# Patient Record
Sex: Male | Born: 1970 | Race: Black or African American | Hispanic: No | Marital: Single | Smoking: Current every day smoker
Health system: Southern US, Community
[De-identification: ages and names within clinical notes are randomized; demographics above are authoritative.]

## PROBLEM LIST (undated history)

## (undated) ENCOUNTER — Emergency Department (HOSPITAL_COMMUNITY): Discharge status: Absent without leave | Payer: Self-pay

## (undated) DIAGNOSIS — S21119A Laceration without foreign body of unspecified front wall of thorax without penetration into thoracic cavity, initial encounter: Secondary | ICD-10-CM

## (undated) HISTORY — PX: CHEST TUBE INSERTION: SHX231

## (undated) HISTORY — PX: OTHER SURGICAL HISTORY: SHX169

---

## 1997-10-17 ENCOUNTER — Emergency Department (HOSPITAL_COMMUNITY): Admission: EM | Admit: 1997-10-17 | Discharge: 1997-10-17 | Payer: Self-pay | Admitting: Emergency Medicine

## 1999-07-18 ENCOUNTER — Emergency Department (HOSPITAL_COMMUNITY): Admission: EM | Admit: 1999-07-18 | Discharge: 1999-07-18 | Payer: Self-pay | Admitting: Emergency Medicine

## 2000-01-22 ENCOUNTER — Inpatient Hospital Stay (HOSPITAL_COMMUNITY): Admission: EM | Admit: 2000-01-22 | Discharge: 2000-01-24 | Payer: Self-pay | Admitting: Emergency Medicine

## 2000-01-22 ENCOUNTER — Encounter: Payer: Self-pay | Admitting: Emergency Medicine

## 2002-03-18 ENCOUNTER — Emergency Department (HOSPITAL_COMMUNITY): Admission: AC | Admit: 2002-03-18 | Discharge: 2002-03-18 | Payer: Self-pay

## 2002-03-18 ENCOUNTER — Encounter: Payer: Self-pay | Admitting: Emergency Medicine

## 2004-02-04 ENCOUNTER — Inpatient Hospital Stay (HOSPITAL_COMMUNITY): Admission: EM | Admit: 2004-02-04 | Discharge: 2004-02-08 | Payer: Self-pay | Admitting: Emergency Medicine

## 2005-02-09 ENCOUNTER — Emergency Department (HOSPITAL_COMMUNITY): Admission: EM | Admit: 2005-02-09 | Discharge: 2005-02-09 | Payer: Self-pay | Admitting: Emergency Medicine

## 2005-03-10 ENCOUNTER — Emergency Department (HOSPITAL_COMMUNITY): Admission: EM | Admit: 2005-03-10 | Discharge: 2005-03-10 | Payer: Self-pay | Admitting: Emergency Medicine

## 2008-04-03 ENCOUNTER — Inpatient Hospital Stay (HOSPITAL_COMMUNITY): Admission: EM | Admit: 2008-04-03 | Discharge: 2008-04-06 | Payer: Self-pay | Admitting: Emergency Medicine

## 2010-11-11 NOTE — H&P (Signed)
NAME:  Dennis Bailey, Dennis Bailey                 ACCOUNT NO.:  [id number]   MEDICAL RECORD NO.:  [id number]          PATIENT TYPE:  EMS   LOCATION:  ED                           FACILITY:  [facility]   PHYSICIAN:  Lucita Ferrara, MD         DATE OF BIRTH:  05/24/1971   DATE OF ADMISSION:  04/03/2008  DATE OF DISCHARGE:                              HISTORY & PHYSICAL   PRIMARY CARE DOCTOR:  Unassigned.   CHIEF COMPLAINT:  Abdominal pain.   HISTORY OF PRESENT ILLNESS:  The patient is 40 year old African American  male presents to [facility] with chief complaint of  abdominal pain located midepigastric, much similar to his previous  abdominal pain which he has had in the past when he was diagnosed with  pancreatitis.  The patient states that whenever he drinks alcohol, he  has exacerbation of his pancreatitis.  The patient last drank liquor  all day on Saturday and abdominal pain started on Monday.  He has, in  addition to abdominal pain, nausea and vomiting that is nonbilious,  nonbloody.  Abdominal pain is radiating to his lower back.  He is unable  to tolerate any p.o.  He denies any chest pain, shortness of breath,  orthopnea.  Otherwise his review of systems is negative.  Denies any  fevers, urinary frequency, urgency or burning.   PAST MEDICAL HISTORY:  1. Significant for chronic alcohol abuse.  2. Acute pancreatitis.   SOCIAL HISTORY:  He smokes 1/2 pack per day for the last 1 year.  He is  a heavy drinker.  He denies other drugs.   ALLERGIES:  No known drug allergies.   MEDICATIONS:  None.   PHYSICAL EXAMINATION:  GENERAL:  The patient is in no acute distress.  VITAL SIGNS:  Blood pressure is 129/87, pulse 71, respirations 20,  temperature 97.8.  HEENT: Normocephalic, atraumatic.  Sclerae is anicteric.  Neck is supple.  No JVD, no carotid bruits.  CARDIOVASCULAR:  S1, S2 regular rate and rhythm.  No murmurs, rubs or  clicks.  ABDOMEN:  There are positive bowel sounds.  The  patient is tender to  palpation.  There is no guarding and no hepatosplenomegaly.  EXTREMITIES:  No clubbing, cyanosis or edema.  NEURO:  Patient is alert, oriented x3.  Cranial nerves 2-12 grossly  intact.   The patient was started on IV Rocephin in the emergency room.  The  patient also was given Dilaudid and Zofran for antiemetic purposes and  pain control.  CT of the abdomen and pelvis shows finding consistent  with acute pancreatitis.  Lipase is 111, blood alcohol level less than  5, and the patient's AST, ALT is 75, 13, alk phos is 103, BUN 12,  creatinine 1.18, white count 20.8, hemoglobin 16.9, hematocrit 49.2, MCV  is 94.1, platelets 293,000.  Urinalysis shows no leukocyte esterase or  nitrites.   ASSESSMENT AND PLAN:  A 40 year old with severe abdominal pain secondary  to;  1. Acute pancreatitis with nausea and vomiting and inability to      tolerate p.o.  2. Chronic alcohol abuse.  3. Tobacco abuse.  4. Leukocytosis likely reactive and secondary to #1 with no fevers,      chills or sources of acute infection.   DISCUSSION AND PLAN:  1. Will go ahead and admit the patient to medical telemetry unit.      Will keep the patient n.p.o. will initiate IV fluids, pain control,      and emesis control with Dilaudid and Zofran.  Surgical consultation      only if there is an acute abdomen and the patient's symptoms are      not resolved.  Will need to monitor lytes, Ranson's criteria, which      is pretty low at this point.  2. Alcohol abuse.  Will put the patient on Ativan protocol and will      also get smoking cessation literature in addition to a nicotine      patch.   The rest of the plans are dependent on his progress and consultant  recommendations.      Lucita Ferrara, MD  Electronically Signed     RR/MEDQ  D:  04/03/2008  T:  04/03/2008  Job:  [id number]

## 2010-11-11 NOTE — Discharge Summary (Signed)
NAME:  Dennis Bailey, Dennis Bailey                 ACCOUNT NO.:  [id number]   MEDICAL RECORD NO.:  [id number]          PATIENT TYPE:  INP   LOCATION:  1439                         FACILITY:  [facility]   PHYSICIAN:  Lonia Blood, M.D.      DATE OF BIRTH:  08-22-1970   DATE OF ADMISSION:  04/03/2008  DATE OF DISCHARGE:                               DISCHARGE SUMMARY   PRIMARY CARE PHYSICIAN:  Unassigned.   DISCHARGE DIAGNOSIS:  1. Acute alcoholic pancreatitis.  2. Alcoholism.  3. Tobacco abuse.  4. Leukocytosis, all resolved.   DISCHARGE MEDICATIONS:  1. Thiamine 100 mg daily.  2. Folic acid 1 mg daily.  3. Ativan 1 mg q.8 h p.r.n.  4. Multivitamins daily.   DISPOSITION:  The patient is being discharged home in good health.  His  pancreatitis has resolved.  He has been advised to quit drinking.  He  has been counseled extensively on drinking and he promises to go to the  [facility] program.  Of note, the patient is going to follow-up in the clinic  with his doctor.  Further counseling will take place.   PROCEDURES PERFORMED THIS ADMISSION:  1. Abdominal x-ray on October 6 that shows no acute findings.  2. CT abdomen and pelvis also on April 03, 2008, shows findings      consistent with acute pancreatitis, but no pseudocyst or abscess.   CONSULTATIONS:  None.   BRIEF HISTORY AND PHYSICAL:  Please refer to dictated history and  physical on admission by Dr. Flonnie Overman.  In short, the patient is 40-year-  old gentleman with history of alcohol intake and previous history of  pancreatitis coming in with exacerbation of pancreatitis with mainly  abdominal pain, nausea, vomiting.  Findings at the time of admission  including lipase etc of 111 indicated that the patient was having an  acute pancreatitis attack, most likely triggered by alcohol.  The  patient was subsequently admitted for treatment.   HOSPITAL COURSE:  1. Acute pancreatitis.  The patient was admitted, had bowel rest, pain      medication, IV  fluids.  The patient responded after 48 hours.  His      lipase has normalized.  He was restarted on diet and seemed to have      tolerated everything well.  At this point, we are discharging him      home with counseling on alcohol intake.  2. Alcohol abuse.  Again the patient was counseled extensively during      this hospitalization as indicated above.  3. Tobacco abuse.  The patient received a nicotine patch in the      hospital.  He has also been counseled extensively to quit that.  4. Leukocytosis.  His white count was a size 22,000, probably from the      inflammation.  It has been continuously      decreasing.  5. Acute renal failure.  His creatinine rose to 1.29.  But this has      been trending downwards since.  Otherwise, the patient is stable  for discharge and will proceed with discharge.      Lonia Blood, M.D.  Electronically Signed     LG/MEDQ  D:  04/06/2008  T:  04/06/2008  Job:  [id number]

## 2010-11-14 NOTE — Discharge Summary (Signed)
[place]  Patient:    Dennis Bailey, Dennis Bailey                          MRN: [id number] Adm. Date:  [id number] Disc. Date: [id number] Attending:  Edwyna Perfect Dictator:   Susie Cassette, M.D. CC:         Leory Plowman, M.D.                           Discharge Summary  DISCHARGE DIAGNOSES: 1. Mild alcoholic pancreatitis. 2. Alcohol abuse.  DISCHARGE MEDICATIONS:  Darvocet one tablet q.6h. p.r.n. for pain.  FOLLOW-UP:  [name] was instructed that the clinic at [facility] will contact him for a follow-up appointment.  PROCEDURES:  CT of the abdomen and pelvis revealed that [name] were compatible with pancreatitis.  There was also peripancreatic fluid extending down both pericolic gutters with minimal perihepatic ascites also seen.  CONSULTATIONS:  None.  HISTORY OF PRESENT ILLNESS:  This is a 40 year old African-American male with a chief complaint of abdominal pain.  [name] presented to the ER with complaints of abdominal cramping x 2 days, which started after he drank alcohol.  This was associated with nausea and vomiting.  The vomiting occurred every one to two hours.  It initially consisted of food particles and was later noted to be bilious.  He denied blood in this vomit.  The pain was worsened with food intake.  [name] tried Percocet, Advil, and Pepto-Bismol for this pain, but none of these improved his symptoms.  He does admit to drinking a half of a case of beer and a pint of liquor on Monday, after which his symptoms started.  He also reports nonbloody diarrhea.  He denies a history of reflux disease, peptic ulcer disease, or pancreatitis.  PAST MEDICAL HISTORY:  Not remarkable.  MEDICATIONS PRIOR TO ADMISSION:  None.  ALLERGIES:  None.  FAMILY HISTORY:  [name] mother had hypertension.  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  He smokes one pack a  day.  He drinks a six-pack a day of beer. He denies IV drug use and cocaine use.  PHYSICAL EXAMINATION:  Temperature 99.5 degrees, pulse 108, respirations 22, BP 117/96, saturations 95% on room air.  GENERAL APPEARANCE:  This an alert and oriented x 3, well-nourished, African-American male.  HEENT:  Significant for pupils equally round and reactive to light. Extraocular muscles intact.  Oral mucosa moist.  NECK:  No JVD.  No bruits.  The neck is supple.  CARDIOVASCULAR:  S1 and S2 are present with a regular rhythm.  Tachycardic. No murmurs, rubs, or gallops.  RESPIRATORY:  Clear to auscultation bilaterally.  ABDOMEN:  Tenderness in the epigastric region and the periumbilical area. Nondistended.  No rebound.  EXTREMITIES:  No clubbing, cyanosis, or edema.  The pulses are 2+.  RECTAL:  Rectal tone is good.  Stool is guaiac negative.  NEUROLOGIC:  Nothing focal.  cranial nerves II-XII grossly intact.  SKIN:  Warm and dry.  ADMISSION LABORATORY DATA:  The white count was 20.8, hemoglobin 17, and platelets 197.  Sodium 131, potassium 3.9, chloride 96, bicarbonate 25, BUN 10, creatinine 1.1, glucose 115, calcium 9.3, total protein 6.6, albumin 3.6, AST 31, ALT 11, alkaline phosphatase 87, total bilirubin 1.6, amylase 168, lipase 100.  Urinalysis:  Protein greater than  300, nitrite positive, leukocyte esterase trace, white blood cells 0-5, red blood cells 6-10, many bacteria, and granular and amorphous casts.  CT of the abdomen:  Pancreatic fluid in the pericolic gutters.  HOSPITAL COURSE: #1 - MILD ALCOHOLIC PANCREATITIS:  [name] was initially admitted with abdominal pain and evidence of pancreatitis both on abdominal CT, as well as enzyme levels of amylase and lipase.  He was initially managed by being made NPO and started on D5 normal saline at 200 cc/hr.  In addition, he was given morphine and Phenergan for pain and nausea.  By the next morning, [name] was anxious to  begin p.o. intake, he was gradually backed down on his IV fluids, is pain medicines were changed to p.o., and his diet was advanced.  He tolerated this advance in diet well and was requiring minimal pain medication. The next morning, he was sent home with p.o. Darvocet.  #2 - ALCOHOL ABUSE:  [name] was repeated with thiamine and folate during his stay in the hospital.  In addition, he was seen by care management for evaluation and referral of alcohol abuse.  They discussed with him services available to substance abusers and reviewed a resource fact sheet with him. He did express interest in these services.  In addition, an intensive outpatient program was also discussed with him.  DISCHARGE LABORATORY DATA:  None.  DISPOSITION:  To home.  CONDITION ON DISCHARGE:  Good. DD:  01/28/00 TD:  01/29/00 Job: [id number]
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
## 2010-11-14 NOTE — H&P (Signed)
NAME:  Dennis Bailey, Dennis Bailey                             ACCOUNT NO.:  [id number]   MEDICAL RECORD NO.:  [id number]                   PATIENT TYPE:  EMS   LOCATION:  ED                                   FACILITY:  [facility]   PHYSICIAN:  Hollice Espy, M.D.            DATE OF BIRTH:  11-Jul-1970   DATE OF ADMISSION:  02/04/2004  DATE OF DISCHARGE:                                HISTORY & PHYSICAL   CHIEF COMPLAINT:  Abdominal pain.   The patient is a 40 year old African-American male with no past medical  history, who drinks approximately 3 beers every day, and his last drink was  yesterday evening.  In the middle of the night, he started having severe  abdominal pain, located mostly in the mid epigastric region, radiating to  his back.  He denied any vomiting but was having nausea.  Pain was  continuous, occasionally going up and down in the severity.  He became  concerned and came into the emergency room for further evaluation.  In the  emergency room, he was found to have a lipase level of 191 and was diagnosed  with pancreatitis.  The patient was given IV fluids, pain medication, and  antinausea medicine.  He still complains of some abdominal pain but feels a  little bit better.  He denies any other symptoms.  He denies any headaches,  visual changes, dysphagia, chest pain, palpitations.  Denies any shortness  of breath, wheeze, cough, hematuria, dysuria, constipation, no diarrhea.  He  denies any focal extremity weakness, although overall he feels fatigued.   PAST MEDICAL HISTORY:  None.   MEDICATIONS:  None.   ALLERGIES:  None.   SOCIAL HISTORY:  He admits to smoking a pack a day for many years.  He  drinks at least 3 beers a day, but I feel he is likely understating this.  He denies any drug use.   FAMILY HISTORY:  Positive for CAD and hypertension.   PHYSICAL EXAMINATION:  VITAL SIGNS:  Temp 96.6, heart rate 75, blood  pressure 127/85, respirations 22, O2 saturation 97% on room  air.  GENERAL:  He alert and oriented x 3, in some minor distress secondary to his  abdominal pan.  HEENT:  Normocephalic, atraumatic.  His mucous membranes are slightly dry.  He has no carotid bruits.  HEART:  Regular rate and rhythm, S1 and S2 clear to auscultation  bilaterally.  ABDOMEN:  Soft and nondistended.  It is slightly tender in mid epigastric  region.  He has hypoactive bowel sounds.  EXTREMITIES:  No cyanosis, clubbing, or edema.   LABORATORY DATA:  His UA is noted to have a trace hemoglobin, 30 of protein.  White count is 15.8, H&H 16.5 and 47.7, MCV 93, platelet count 290 with 72%  neutrophils which is normal.  Sodium 139, potassium 3.8, chloride 105,  bicarb 25, BUN 11, creatinine 1.6, glucose 111.  Calcium is 9.1.  AST is  slightly elevated at 40.  The rest of his LFTs are within normal limits.  His lipase level is elevated at 191.  His UA is essentially normal  otherwise.   ASSESSMENT AND PLAN:  1. Pancreatitis secondary to alcohol abuse, first episode.  NPO and follow     his lipase level.  2. Alcohol abuse, 3 beers a day.  He likely drinks more than this but watch     for withdrawal signs.  He is advised to quit drinking.  3. Tobacco abuse.                                               Hollice Espy, M.D.    SKK/MEDQ  D:  02/04/2004  T:  02/04/2004  Job:  [id number]
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
## 2010-11-14 NOTE — Discharge Summary (Signed)
NAME:  Dennis Bailey, Dennis Bailey                             ACCOUNT NO.:  [id number]   MEDICAL RECORD NO.:  [id number]                   PATIENT TYPE:  INP   LOCATION:  0347                                 FACILITY:  [facility]   PHYSICIAN:  Jackie Plum, M.D.             DATE OF BIRTH:  [birth year]   DATE OF ADMISSION:  02/04/2004  DATE OF DISCHARGE:  02/08/2004                                 DISCHARGE SUMMARY   DISCHARGE DIAGNOSES:  1. Alcoholic pancreatitis, resolved.  2. Alcohol and tobacco abuse.  3. Mild normocytic anemia, stable.  Outpatient followup recommended.   DISCHARGE MEDICATIONS:  1. Darvocet-N 100 one to two tablets q.4-6h. p.r.n.  2. Protonix 40 mg daily.   ACTIVITY:  As tolerated.   DIET:  Regular diet to be continued.   FOLLOWUP:  The patient is to report to the M.D. if he experiences any  problems.  He is to follow up with [facility] to see a Dennis Bailey in  two weeks.   CONSULTANTS:  Not applicable.   PROCEDURES:  Not applicable.   CONDITION ON DISCHARGE:  Improved and satisfactory.   REASON FOR ADMISSION:  Acute pancreatitis.   HISTORY OF PRESENT ILLNESS:  The patient had been binging on alcohol, i.e.  beer about three beers every day and presented with severe abdominal pain in  his mid epigastric region radiating to his back.  He had elevated lipase and  amylase and therefore was admitted.  At the time of admission, the BP was  127/85 with a pulse rate of 75 and he was afebrile.  His saturation on room  air was 97%.  The abdominal exam revealed a mid epigastric tenderness.  Laboratory work revealed elevated lipase and amylase.  He was therefore  admitted for alcohol-induced pancreatitis.   HOSPITAL COURSE:  He was admitted to the hospitalist service to a regular  bed.  He was started on a regimen of bowel rest and supportive care with IV  fluid supplementation and analgesics.  His laboratories were monitored.  The  next day, the patient's symptoms  actually go a little bit worse.  He had  some fever.  On account of concerns of complications of his pancreatitis, a  CT scan of the abdomen was done on February 05, 2004, and the results indicated  findings consistent with considerable edema and __________ surrounding the  pancreas and extending along the right side of the liver and both  pericolonic gutters and into the pelvis.  There was no pseudocyst or  abscess.  His antibiotics were subsequently discontinued without any fevers.  His symptoms improved and he was started on clear liquids, which was  subsequently advanced and the patient was able to tolerate a full regular  diet at the time of discharge.  He is therefore being discharged home on  stable and satisfactory condition to continue with a regular diet.  He has  been  counseled to stop binging on alcohol.  The effect of alcohol on his  general health was also discussed.  On rounds this morning, the patient is  feeling well.  He does not have any nausea or vomiting.  He had just mild  epigastric pain.  His BP was 142/90 with a pulse of 87, a temperature of  98.6 degrees Fahrenheit and a respiratory rate of 20.  He was not acutely  ill looking.  His mucous membranes were moist.  He did not have any  irritation or edema of his mucous membranes.  His lungs were clear to  auscultation.  The abdominal exam was negative for any tenderness.  Bowel  sounds were present and were normoactive.  Extremity exam with no edema.  He  was alert and oriented x 3 with no acute focal deficits.  Laboratory work  this morning showed WBC count of 10.0, hemoglobin of 12.8, hematocrit of  37.0, MCV 94.5 and platelet count 317.  Sodium 140, potassium 4.0, chloride  106, CO2 29, glucose 99, BUN 3, creatinine 1.1, calcium 8.7, amylase 142,  lipase 82.  Dennis Bailey is therefore being discharged home in stable condition  to follow up with [facility].                                                Jackie Plum, M.D.    GO/MEDQ  D:  02/08/2004  T:  02/08/2004  Job:  [id number]   cc:   [facility]

## 2011-01-20 ENCOUNTER — Encounter: Payer: Self-pay | Admitting: *Deleted

## 2011-01-20 DIAGNOSIS — L0231 Cutaneous abscess of buttock: Secondary | ICD-10-CM | POA: Insufficient documentation

## 2011-01-20 DIAGNOSIS — Z5320 Procedure and treatment not carried out because of patient's decision for unspecified reasons: Secondary | ICD-10-CM | POA: Insufficient documentation

## 2011-01-20 NOTE — ED Notes (Signed)
Pt reports "bump" to right buttock x 4 months; is here tonight because he is concerned this may be a cancerous tumor

## 2011-01-21 ENCOUNTER — Emergency Department (HOSPITAL_COMMUNITY)
Admission: EM | Admit: 2011-01-21 | Discharge: 2011-01-21 | Discharge status: Against Medical Advice | Payer: Self-pay | Attending: *Deleted | Admitting: *Deleted

## 2011-01-21 ENCOUNTER — Encounter (HOSPITAL_COMMUNITY): Payer: Self-pay | Admitting: Emergency Medicine

## 2011-01-21 ENCOUNTER — Emergency Department (HOSPITAL_COMMUNITY)
Admission: EM | Admit: 2011-01-21 | Discharge: 2011-01-21 | Disposition: A | Discharge status: Home / Self Care | Payer: Self-pay | Attending: Emergency Medicine | Admitting: Emergency Medicine

## 2011-01-21 DIAGNOSIS — F17200 Nicotine dependence, unspecified, uncomplicated: Secondary | ICD-10-CM | POA: Insufficient documentation

## 2011-01-21 DIAGNOSIS — K612 Anorectal abscess: Secondary | ICD-10-CM | POA: Insufficient documentation

## 2011-01-21 MED ORDER — HYDROCODONE-ACETAMINOPHEN 5-500 MG PO TABS: 1.0000 | ORAL_TABLET | Freq: Four times a day (QID) | ORAL | Status: AC | PRN

## 2011-01-21 MED ORDER — DOXYCYCLINE HYCLATE 100 MG PO CAPS: 100.0000 mg | ORAL_CAPSULE | Freq: Two times a day (BID) | ORAL | Status: AC

## 2011-01-21 NOTE — ED Notes (Signed)
Unable to locate pt in all waiting areas 

## 2011-01-21 NOTE — ED Provider Notes (Signed)
History     Chief Complaint  Patient presents with  . Recurrent Skin Infections   HPI Comments: Patient c/o persistent "bump" to left perineal area for 4 months.  States the bump swells but then goes down but does not resolve.  Also c/o itching to the area.  He denies dysuria, rectal pain or incontinence  Patient is a 40 y.o. male presenting with abscess. The history is provided by the patient.  Abscess  This is a chronic problem. The current episode started more than one week ago. The onset is undetermined. The problem occurs continuously. The problem has been gradually improving. The abscess is present on the left buttock. The problem is mild. The abscess is characterized by itchiness, painfulness and swelling. Associated with: nothing. Pertinent negatives include no decrease in physical activity, no fever, no diarrhea, no vomiting and no decreased responsiveness. There were no sick contacts. He has received no recent medical care.    History reviewed. No pertinent past medical history.  History reviewed. No pertinent past surgical history.  History reviewed. No pertinent family history.  History  Substance Use Topics  . Smoking status: Current Everyday Smoker -- 0.5 packs/day for 10 years    Types: Cigarettes  . Smokeless tobacco: Not on file  . Alcohol Use: 1.8 oz/week    3 Cans of beer per week     daily      Review of Systems  Constitutional: Negative for fever, chills, decreased responsiveness and unexpected weight change.  HENT: Negative.   Respiratory: Negative.   Cardiovascular: Negative.   Gastrointestinal: Negative.  Negative for vomiting and diarrhea.  Genitourinary: Negative for dysuria, frequency, decreased urine volume, discharge, penile swelling, scrotal swelling, penile pain and testicular pain.  Musculoskeletal: Negative for back pain and gait problem.  Skin: Positive for wound.  Neurological: Negative for weakness, numbness and headaches.  Hematological:  Does not bruise/bleed easily.    Physical Exam  BP 128/77  Pulse 87  Temp(Src) 98.6 F (37 C) (Oral)  Resp 18  Ht 6\' 2"  (1.88 m)  Wt 161 lb (73.029 kg)  BMI 20.67 kg/m2  SpO2 96%  Physical Exam  Nursing note and vitals reviewed. Constitutional: He is oriented to person, place, and time. He appears well-developed and well-nourished. No distress.  HENT:  Head: Normocephalic and atraumatic.  Cardiovascular: Normal rate, regular rhythm and normal heart sounds.   Pulmonary/Chest: Effort normal and breath sounds normal.  Abdominal: Soft. Bowel sounds are normal. There is no tenderness. There is no rebound and no guarding.  Genitourinary: Penis normal. Rectal exam shows tenderness. Rectal exam shows no external hemorrhoid. No penile tenderness.     Musculoskeletal: He exhibits no edema and no tenderness.  Neurological: He is alert and oriented to person, place, and time.  Skin: No rash noted. No erythema.       nodule  Psychiatric: He has a normal mood and affect.    ED Course  Procedures  MDM   Palpable firm 2cm  nodule to left perineum.  No fluctuance, erythema, drainage or swelling.  Possible cyst vs abscess.  I have discussed the diff dx with the pt and he agrees to try abx therapy and will f/u with a surgeon if sx's are not improving.        Neev Mcmains L. Jeannene Tschetter, PA 01/21/11 2048  Keasha Malkiewicz L. [place], [place] 01/26/11 2312
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
## 2011-02-06 NOTE — ED Provider Notes (Signed)
Evaluation and management procedures were performed by the PA/NP under my supervision/collaboration.   Felisa Bonier, MD 02/06/11 [phone]

## 2011-03-06 ENCOUNTER — Emergency Department (HOSPITAL_COMMUNITY)
Admission: EM | Admit: 2011-03-06 | Discharge: 2011-03-06 | Disposition: A | Discharge status: Home / Self Care | Payer: Self-pay | Attending: Emergency Medicine | Admitting: Emergency Medicine

## 2011-03-06 ENCOUNTER — Encounter (HOSPITAL_COMMUNITY): Payer: Self-pay | Admitting: *Deleted

## 2011-03-06 DIAGNOSIS — F17200 Nicotine dependence, unspecified, uncomplicated: Secondary | ICD-10-CM | POA: Insufficient documentation

## 2011-03-06 DIAGNOSIS — H6120 Impacted cerumen, unspecified ear: Secondary | ICD-10-CM | POA: Insufficient documentation

## 2011-03-06 NOTE — ED Notes (Signed)
Irrigated left ear with peroxide and warm water. Large amount of wax removed. PA at bedside to assess pt.

## 2011-03-06 NOTE — ED Provider Notes (Signed)
History     CSN: [id number] Arrival date & time: 03/06/2011  9:30 AM  Chief Complaint  Patient presents with  . Otalgia    reports ear "clogged up" with sand and wax   HPI Comments: Can't hear out of L ear.  Wife has been digging in his L ear with a q-tip without improvement.  Patient is a 40 y.o. male presenting with ear pain. The history is provided by the patient and the spouse. No language interpreter was used.  Otalgia This is a new problem. The current episode started 2 days ago. There is pain in the left ear. The problem occurs constantly. The problem has not changed since onset.There has been no fever. The pain is mild. Associated symptoms include hearing loss. Pertinent negatives include no ear discharge, no headaches, no rhinorrhea, no sore throat, no abdominal pain, no diarrhea, no vomiting, no neck pain, no cough and no rash.    History reviewed. No pertinent past medical history.  History reviewed. No pertinent past surgical history.  No family history on file.  History  Substance Use Topics  . Smoking status: Current Everyday Smoker -- 0.5 packs/day for 10 years    Types: Cigarettes  . Smokeless tobacco: Not on file  . Alcohol Use: 1.8 oz/week    3 Cans of beer per week     daily      Review of Systems  HENT: Positive for hearing loss and ear pain. Negative for sore throat, rhinorrhea, neck pain and ear discharge.   Respiratory: Negative for cough.   Gastrointestinal: Negative for vomiting, abdominal pain and diarrhea.  Skin: Negative for rash.  Neurological: Negative for headaches.  All other systems reviewed and are negative.    Physical Exam  BP 128/79  Pulse 92  Temp(Src) 98.3 F (36.8 C) (Oral)  Resp 18  Ht 6\' 2"  (1.88 m)  Wt 160 lb (72.576 kg)  BMI 20.54 kg/m2  SpO2 97%  Physical Exam  Nursing note and vitals reviewed. Constitutional: He is oriented to person, place, and time. Vital signs are normal. He appears well-developed and  well-nourished. No distress.  HENT:  Head: Normocephalic and atraumatic.  Right Ear: External ear normal.  Nose: Nose normal.  Mouth/Throat: No oropharyngeal exudate.       L TM not visible secondary to cerumen.  Rn irrigated with peroxide and NS.  Large chunk of cerumen removed and TM now visible.  No redness, bulging or air/fluid levels seen.  Pt can hear much better.  Eyes: Conjunctivae and EOM are normal. Pupils are equal, round, and reactive to light. Right eye exhibits no discharge. Left eye exhibits no discharge. No scleral icterus.  Neck: Normal range of motion. Neck supple. No JVD present. No tracheal deviation present. No thyromegaly present.  Cardiovascular: Normal rate, regular rhythm, normal heart sounds, intact distal pulses and normal pulses.  Exam reveals no gallop and no friction rub.   No murmur heard. Pulmonary/Chest: Effort normal and breath sounds normal. No stridor. No respiratory distress. He has no wheezes. He has no rales. He exhibits no tenderness.  Abdominal: Soft. Normal appearance and bowel sounds are normal. He exhibits no distension and no mass. There is no tenderness. There is no rebound and no guarding.  Musculoskeletal: Normal range of motion. He exhibits no edema and no tenderness.  Lymphadenopathy:    He has no cervical adenopathy.  Neurological: He is alert and oriented to person, place, and time. He has normal reflexes. No cranial nerve  deficit. Coordination normal. GCS eye subscore is 4. GCS verbal subscore is 5. GCS motor subscore is 6.  Reflex Scores:      Tricep reflexes are 2+ on the right side and 2+ on the left side.      Bicep reflexes are 2+ on the right side and 2+ on the left side.      Brachioradialis reflexes are 2+ on the right side and 2+ on the left side.      Patellar reflexes are 2+ on the right side and 2+ on the left side.      Achilles reflexes are 2+ on the right side and 2+ on the left side. Skin: Skin is warm and dry. No rash  noted. He is not diaphoretic.  Psychiatric: He has a normal mood and affect. His speech is normal and behavior is normal. Judgment and thought content normal. Cognition and memory are normal.    ED Course  Procedures  MDM       Worthy Rancher, PA 03/06/11 1033

## 2011-03-06 NOTE — ED Notes (Addendum)
C/o left earache r/t being impacted with sand and wax; pt reports he just returned from the beach.

## 2011-03-07 NOTE — ED Provider Notes (Signed)
Medical screening examination/treatment/procedure(s) were performed by non-physician practitioner and as supervising physician I was immediately available for consultation/collaboration.   Laray Anger, DO 03/07/11 1443

## 2011-03-30 LAB — COMPREHENSIVE METABOLIC PANEL
ALT: 30
ALT: 34
AST: 74 — ABNORMAL HIGH
Albumin: 2.6 — ABNORMAL LOW
Albumin: 2.9 — ABNORMAL LOW
Alkaline Phosphatase: 103
Alkaline Phosphatase: 95
CO2: 31
Calcium: 8.3 — ABNORMAL LOW
Calcium: 8.4
Calcium: 8.9
Creatinine, Ser: 1.29
GFR calc Af Amer: >60
GFR calc Af Amer: >60
GFR calc non Af Amer: >60
GFR calc non Af Amer: >60
GFR calc non Af Amer: >60
Glucose, Bld: 128 — ABNORMAL HIGH
Glucose, Bld: 142 — ABNORMAL HIGH
Potassium: 3.8
Sodium: 141
Total Bilirubin: 1
Total Bilirubin: 1.6 — ABNORMAL HIGH
Total Protein: 5.7 — ABNORMAL LOW
Total Protein: 5.9 — ABNORMAL LOW

## 2011-03-30 LAB — BASIC METABOLIC PANEL
Calcium: 8.7
Chloride: 108
GFR calc Af Amer: >60
Glucose, Bld: 109 — ABNORMAL HIGH
Potassium: 3.9

## 2011-03-30 LAB — CBC
HCT: 40.8
HCT: 44.3
HCT: 49.2
Hemoglobin: 13.9
Hemoglobin: 16.9
MCHC: 34.2
MCV: 94.1
MCV: 95
MCV: 95.2
Platelets: 227
Platelets: 228
Platelets: 293
RBC: 4.29
RDW: 14.4

## 2011-03-30 LAB — URINALYSIS, ROUTINE W REFLEX MICROSCOPIC
Glucose, UA: NEGATIVE
Leukocytes, UA: NEGATIVE
Leukocytes, UA: NEGATIVE
Nitrite: NEGATIVE
Protein, ur: NEGATIVE
Specific Gravity, Urine: 1.017
Urobilinogen, UA: 0.2
pH: 6
pH: 6

## 2011-03-30 LAB — LIPASE, BLOOD: Lipase: 11

## 2011-03-30 LAB — DIFFERENTIAL
Eosinophils Relative: 0
Lymphs Abs: 1.7
Monocytes Absolute: 1.2 — ABNORMAL HIGH
Monocytes Relative: 6
Neutrophils Relative %: 86 — ABNORMAL HIGH

## 2011-03-30 LAB — URINE MICROSCOPIC-ADD ON

## 2011-03-30 LAB — URINE CULTURE
Colony Count: NO GROWTH
Culture: NO GROWTH

## 2011-03-30 LAB — CULTURE, BLOOD (ROUTINE X 2)
Culture: NO GROWTH
Culture: NO GROWTH

## 2011-11-18 ENCOUNTER — Encounter (HOSPITAL_COMMUNITY): Payer: Self-pay | Admitting: *Deleted

## 2011-11-18 ENCOUNTER — Other Ambulatory Visit: Payer: Self-pay

## 2011-11-18 ENCOUNTER — Emergency Department (HOSPITAL_COMMUNITY): Payer: Self-pay

## 2011-11-18 ENCOUNTER — Emergency Department (HOSPITAL_COMMUNITY)
Admission: EM | Admit: 2011-11-18 | Discharge: 2011-11-18 | Disposition: A | Discharge status: Home / Self Care | Payer: Self-pay | Attending: Emergency Medicine | Admitting: Emergency Medicine

## 2011-11-18 DIAGNOSIS — R0602 Shortness of breath: Secondary | ICD-10-CM | POA: Insufficient documentation

## 2011-11-18 DIAGNOSIS — R079 Chest pain, unspecified: Secondary | ICD-10-CM | POA: Insufficient documentation

## 2011-11-18 DIAGNOSIS — B349 Viral infection, unspecified: Secondary | ICD-10-CM

## 2011-11-18 DIAGNOSIS — J40 Bronchitis, not specified as acute or chronic: Secondary | ICD-10-CM | POA: Insufficient documentation

## 2011-11-18 DIAGNOSIS — K1370 Unspecified lesions of oral mucosa: Secondary | ICD-10-CM | POA: Insufficient documentation

## 2011-11-18 HISTORY — DX: Laceration without foreign body of unspecified front wall of thorax without penetration into thoracic cavity, initial encounter: S21.119A

## 2011-11-18 LAB — HEPATIC FUNCTION PANEL
ALT: 80 U/L — ABNORMAL HIGH (ref 0–53)
AST: 384 U/L — ABNORMAL HIGH (ref 0–37)
Albumin: 4.4 g/dL (ref 3.5–5.2)
Alkaline Phosphatase: 298 U/L — ABNORMAL HIGH (ref 39–117)
Total Bilirubin: 1.3 mg/dL — ABNORMAL HIGH (ref 0.3–1.2)
Total Protein: 8.5 g/dL — ABNORMAL HIGH (ref 6.0–8.3)

## 2011-11-18 LAB — RAPID URINE DRUG SCREEN, HOSP PERFORMED
Amphetamines: NOT DETECTED
Barbiturates: NOT DETECTED
Benzodiazepines: NOT DETECTED
Cocaine: NOT DETECTED
Opiates: POSITIVE — AB
Tetrahydrocannabinol: NOT DETECTED

## 2011-11-18 LAB — DIFFERENTIAL
Basophils Relative: 0 % (ref 0–1)
Eosinophils Absolute: 0 10*3/uL (ref 0.0–0.7)
Lymphs Abs: 3.5 10*3/uL (ref 0.7–4.0)
Monocytes Absolute: 0.8 10*3/uL (ref 0.1–1.0)
Monocytes Relative: 8 % (ref 3–12)
Neutro Abs: 5.7 10*3/uL (ref 1.7–7.7)

## 2011-11-18 LAB — BASIC METABOLIC PANEL
BUN: 3 mg/dL — ABNORMAL LOW (ref 6–23)
Chloride: 95 mEq/L — ABNORMAL LOW (ref 96–112)
Creatinine, Ser: 1.08 mg/dL (ref 0.50–1.35)
GFR calc Af Amer: >90 mL/min (ref >90)
Glucose, Bld: 123 mg/dL — ABNORMAL HIGH (ref 70–99)

## 2011-11-18 LAB — CBC
HCT: 42.5 % (ref 39.0–52.0)
Hemoglobin: 15.2 g/dL (ref 13.0–17.0)
MCH: 35 pg — ABNORMAL HIGH (ref 26.0–34.0)
MCHC: 35.8 g/dL (ref 30.0–36.0)

## 2011-11-18 LAB — URINALYSIS, ROUTINE W REFLEX MICROSCOPIC
Glucose, UA: NEGATIVE mg/dL
Specific Gravity, Urine: >1.03 — ABNORMAL HIGH (ref 1.005–1.030)
Urobilinogen, UA: 0.2 mg/dL (ref 0.0–1.0)

## 2011-11-18 LAB — URINE MICROSCOPIC-ADD ON

## 2011-11-18 MED ORDER — ONDANSETRON HCL 4 MG/2ML IJ SOLN
4.0000 mg | Freq: Once | INTRAMUSCULAR | Status: AC
  Administered 2011-11-18: 4 mg via INTRAVENOUS
  Filled 2011-11-18: qty 2

## 2011-11-18 MED ORDER — PROMETHAZINE HCL 25 MG PO TABS: 25.0000 mg | ORAL_TABLET | Freq: Four times a day (QID) | ORAL | Status: DC | PRN

## 2011-11-18 MED ORDER — HYDROCODONE-ACETAMINOPHEN 5-325 MG PO TABS: 1.0000 | ORAL_TABLET | Freq: Four times a day (QID) | ORAL | Status: AC | PRN

## 2011-11-18 MED ORDER — SODIUM CHLORIDE 0.9 % IV SOLN: INTRAVENOUS | Status: DC

## 2011-11-18 MED ORDER — SODIUM CHLORIDE 0.9 % IV BOLUS (SEPSIS)
1000.0000 mL | Freq: Once | INTRAVENOUS | Status: AC
  Administered 2011-11-18: 1000 mL via INTRAVENOUS

## 2011-11-18 NOTE — ED Notes (Signed)
MD at bedside. 

## 2011-11-18 NOTE — Discharge Instructions (Signed)
Return for new or worse symptoms take antinausea medicine as directed pain medicine as needed chest pain workup was negative in the emergency department suspect a viral illness due to the constellation of symptoms. May also be a mild bronchitis as well.

## 2011-11-18 NOTE — ED Notes (Addendum)
Pt presents to er with c/o left shoulder pain that radiates down left arm, pt states that he was lifting something about three weeks ago and continues to have pain in left shoulder, left arm started to get numb today along with pain in left chest area, pt also c/o throat swelling and sob, pt states that the throat swelling started suddenly associated with being sob, denies any itching, hives or rash, pt pacing in triage room,unable to sit still
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
## 2011-12-21 ENCOUNTER — Emergency Department (HOSPITAL_COMMUNITY)
Admission: EM | Admit: 2011-12-21 | Discharge: 2011-12-21 | Disposition: A | Discharge status: Home / Self Care | Payer: Self-pay | Attending: Emergency Medicine | Admitting: Emergency Medicine

## 2011-12-21 ENCOUNTER — Encounter (HOSPITAL_COMMUNITY): Payer: Self-pay | Admitting: Emergency Medicine

## 2011-12-21 DIAGNOSIS — W57XXXA Bitten or stung by nonvenomous insect and other nonvenomous arthropods, initial encounter: Secondary | ICD-10-CM | POA: Insufficient documentation

## 2011-12-21 DIAGNOSIS — F17200 Nicotine dependence, unspecified, uncomplicated: Secondary | ICD-10-CM | POA: Insufficient documentation

## 2011-12-21 DIAGNOSIS — L089 Local infection of the skin and subcutaneous tissue, unspecified: Secondary | ICD-10-CM | POA: Insufficient documentation

## 2011-12-21 DIAGNOSIS — S30860A Insect bite (nonvenomous) of lower back and pelvis, initial encounter: Secondary | ICD-10-CM | POA: Insufficient documentation

## 2011-12-21 DIAGNOSIS — S90569A Insect bite (nonvenomous), unspecified ankle, initial encounter: Secondary | ICD-10-CM | POA: Insufficient documentation

## 2011-12-21 MED ORDER — DIPHENHYDRAMINE HCL 25 MG PO CAPS
50.0000 mg | ORAL_CAPSULE | Freq: Once | ORAL | Status: AC
  Administered 2011-12-21: 50 mg via ORAL
  Filled 2011-12-21: qty 2

## 2011-12-21 MED ORDER — SULFAMETHOXAZOLE-TMP DS 800-160 MG PO TABS
1.0000 | ORAL_TABLET | Freq: Once | ORAL | Status: AC
  Administered 2011-12-21: 1 via ORAL
  Filled 2011-12-21: qty 1

## 2011-12-21 MED ORDER — SULFAMETHOXAZOLE-TRIMETHOPRIM 800-160 MG PO TABS: 1.0000 | ORAL_TABLET | Freq: Two times a day (BID) | ORAL | Status: AC

## 2011-12-21 NOTE — Discharge Instructions (Signed)
Take all of the antibiotic. Use benadryl for itching.

## 2011-12-21 NOTE — ED Notes (Signed)
Pt has multiple areas of raised red areas over entire body associated with itching, pt states that he had been staying at a house with ?bed bugs and woke up yesterday am with the bites.
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
## 2011-12-21 NOTE — ED Notes (Signed)
Patient states that he has multiple bug bites scattered throughout his body.  States he noticed them yesterday, states they itch a lot.

## 2012-02-13 ENCOUNTER — Emergency Department (HOSPITAL_COMMUNITY)
Admission: EM | Admit: 2012-02-13 | Discharge: 2012-02-13 | Disposition: A | Discharge status: Home / Self Care | Payer: Self-pay | Attending: Emergency Medicine | Admitting: Emergency Medicine

## 2012-02-13 ENCOUNTER — Encounter (HOSPITAL_COMMUNITY): Payer: Self-pay | Admitting: Emergency Medicine

## 2012-02-13 DIAGNOSIS — H61899 Other specified disorders of external ear, unspecified ear: Secondary | ICD-10-CM

## 2012-02-13 DIAGNOSIS — Z008 Encounter for other general examination: Secondary | ICD-10-CM | POA: Insufficient documentation

## 2012-02-13 DIAGNOSIS — F17200 Nicotine dependence, unspecified, uncomplicated: Secondary | ICD-10-CM | POA: Insufficient documentation

## 2012-02-13 MED ORDER — LIDOCAINE VISCOUS 2 % MT SOLN
OROMUCOSAL | Status: AC
  Administered 2012-02-13: 06:00:00
  Filled 2012-02-13: qty 15

## 2012-02-13 NOTE — ED Notes (Signed)
Irrigated Right ear with normal saline, prior to irrigation pt was given 3 drops of viscous 2% lidocaine per Dr Dierdre Highman orders. No noticeable FB was discovered during irrigation. Pt tolerated well.
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
## 2012-02-13 NOTE — ED Notes (Signed)
Patient states he feels like something crawled in his right ear about an hour ago.

## 2012-05-31 ENCOUNTER — Emergency Department (HOSPITAL_COMMUNITY)
Admission: EM | Admit: 2012-05-31 | Discharge: 2012-05-31 | Disposition: A | Discharge status: Home / Self Care | Payer: Self-pay | Attending: Emergency Medicine | Admitting: Emergency Medicine

## 2012-05-31 ENCOUNTER — Encounter (HOSPITAL_COMMUNITY): Payer: Self-pay | Admitting: *Deleted

## 2012-05-31 DIAGNOSIS — F17200 Nicotine dependence, unspecified, uncomplicated: Secondary | ICD-10-CM | POA: Insufficient documentation

## 2012-05-31 DIAGNOSIS — B86 Scabies: Secondary | ICD-10-CM | POA: Insufficient documentation

## 2012-05-31 MED ORDER — DIPHENHYDRAMINE HCL 25 MG PO CAPS
50.0000 mg | ORAL_CAPSULE | Freq: Once | ORAL | Status: AC
  Administered 2012-05-31: 50 mg via ORAL
  Filled 2012-05-31: qty 2

## 2012-05-31 MED ORDER — DIPHENHYDRAMINE HCL 25 MG PO CAPS: 25.0000 mg | ORAL_CAPSULE | Freq: Four times a day (QID) | ORAL | Status: DC | PRN

## 2012-05-31 MED ORDER — PERMETHRIN 5 % EX CREA: TOPICAL_CREAM | CUTANEOUS | Status: DC
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
## 2012-05-31 NOTE — ED Notes (Signed)
Generalized itching rash for 3 weeks.

## 2012-06-01 LAB — RPR: RPR Ser Ql: NONREACTIVE

## 2012-06-03 NOTE — ED Provider Notes (Signed)
Medical screening examination/treatment/procedure(s) were performed by non-physician practitioner and as supervising physician I was immediately available for consultation/collaboration.  Alyona Romack, MD 06/03/12 0622 

## 2012-11-01 ENCOUNTER — Emergency Department (HOSPITAL_COMMUNITY)
Admission: EM | Admit: 2012-11-01 | Discharge: 2012-11-01 | Disposition: A | Discharge status: Home / Self Care | Payer: Self-pay | Attending: Emergency Medicine | Admitting: Emergency Medicine

## 2012-11-01 ENCOUNTER — Encounter (HOSPITAL_COMMUNITY): Payer: Self-pay

## 2012-11-01 DIAGNOSIS — F10929 Alcohol use, unspecified with intoxication, unspecified: Secondary | ICD-10-CM

## 2012-11-01 DIAGNOSIS — F17200 Nicotine dependence, unspecified, uncomplicated: Secondary | ICD-10-CM | POA: Insufficient documentation

## 2012-11-01 DIAGNOSIS — F1010 Alcohol abuse, uncomplicated: Secondary | ICD-10-CM | POA: Insufficient documentation

## 2012-11-01 DIAGNOSIS — Z87828 Personal history of other (healed) physical injury and trauma: Secondary | ICD-10-CM | POA: Insufficient documentation
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
## 2013-01-24 ENCOUNTER — Encounter (HOSPITAL_COMMUNITY): Payer: Self-pay | Admitting: *Deleted

## 2013-01-24 ENCOUNTER — Emergency Department (HOSPITAL_COMMUNITY)
Admission: EM | Admit: 2013-01-24 | Discharge: 2013-01-24 | Disposition: A | Discharge status: Home / Self Care | Payer: Self-pay | Attending: Emergency Medicine | Admitting: Emergency Medicine

## 2013-01-24 DIAGNOSIS — N6320 Unspecified lump in the left breast, unspecified quadrant: Secondary | ICD-10-CM

## 2013-01-24 DIAGNOSIS — M6740 Ganglion, unspecified site: Secondary | ICD-10-CM | POA: Insufficient documentation

## 2013-01-24 DIAGNOSIS — N63 Unspecified lump in unspecified breast: Secondary | ICD-10-CM | POA: Insufficient documentation

## 2013-01-24 DIAGNOSIS — Z87828 Personal history of other (healed) physical injury and trauma: Secondary | ICD-10-CM | POA: Insufficient documentation

## 2013-01-24 DIAGNOSIS — F17200 Nicotine dependence, unspecified, uncomplicated: Secondary | ICD-10-CM | POA: Insufficient documentation

## 2013-01-24 DIAGNOSIS — M67431 Ganglion, right wrist: Secondary | ICD-10-CM

## 2013-01-24 NOTE — ED Notes (Signed)
Reports "lump" on left breast x 2 months, states is getting bigger.

## 2013-01-25 ENCOUNTER — Encounter (HOSPITAL_COMMUNITY): Payer: Self-pay
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
## 2013-01-31 NOTE — ED Provider Notes (Signed)
Medical screening examination/treatment/procedure(s) were performed by non-physician practitioner and as supervising physician I was immediately available for consultation/collaboration.   Laray Anger, DO 01/31/13 1148

## 2013-02-01 ENCOUNTER — Ambulatory Visit (HOSPITAL_COMMUNITY)
Admit: 2013-02-01 | Discharge: 2013-02-01 | Disposition: A | Discharge status: Home / Self Care | Payer: Self-pay | Source: Ambulatory Visit | Attending: Emergency Medicine | Admitting: Emergency Medicine

## 2013-02-01 DIAGNOSIS — N63 Unspecified lump in unspecified breast: Secondary | ICD-10-CM | POA: Insufficient documentation

## 2013-11-29 ENCOUNTER — Ambulatory Visit (HOSPITAL_COMMUNITY)
Admission: EM | Admit: 2013-11-29 | Discharge: 2013-11-30 | Disposition: A | Discharge status: Home / Self Care | Payer: Self-pay | Attending: Orthopedic Surgery | Admitting: Orthopedic Surgery

## 2013-11-29 ENCOUNTER — Encounter (HOSPITAL_COMMUNITY)
Admission: EM | Disposition: A | Discharge status: Home / Self Care | Payer: Self-pay | Source: Home / Self Care | Attending: Emergency Medicine

## 2013-11-29 ENCOUNTER — Emergency Department (HOSPITAL_COMMUNITY): Payer: Self-pay

## 2013-11-29 ENCOUNTER — Emergency Department (HOSPITAL_COMMUNITY): Payer: Self-pay | Admitting: Anesthesiology

## 2013-11-29 ENCOUNTER — Encounter (HOSPITAL_COMMUNITY): Payer: Self-pay | Admitting: Anesthesiology

## 2013-11-29 ENCOUNTER — Encounter (HOSPITAL_COMMUNITY): Payer: Self-pay | Admitting: Emergency Medicine

## 2013-11-29 DIAGNOSIS — F17200 Nicotine dependence, unspecified, uncomplicated: Secondary | ICD-10-CM | POA: Insufficient documentation

## 2013-11-29 DIAGNOSIS — S61451A Open bite of right hand, initial encounter: Secondary | ICD-10-CM | POA: Diagnosis present

## 2013-11-29 DIAGNOSIS — K219 Gastro-esophageal reflux disease without esophagitis: Secondary | ICD-10-CM | POA: Insufficient documentation

## 2013-11-29 DIAGNOSIS — L089 Local infection of the skin and subcutaneous tissue, unspecified: Secondary | ICD-10-CM | POA: Diagnosis present

## 2013-11-29 DIAGNOSIS — W503XXA Accidental bite by another person, initial encounter: Secondary | ICD-10-CM

## 2013-11-29 DIAGNOSIS — S61409A Unspecified open wound of unspecified hand, initial encounter: Secondary | ICD-10-CM | POA: Insufficient documentation

## 2013-11-29 DIAGNOSIS — S62639A Displaced fracture of distal phalanx of unspecified finger, initial encounter for closed fracture: Secondary | ICD-10-CM | POA: Insufficient documentation

## 2013-11-29 HISTORY — PX: I&D EXTREMITY: SHX5045

## 2013-11-29 LAB — COMPREHENSIVE METABOLIC PANEL
ALBUMIN: 3.8 g/dL (ref 3.5–5.2)
ALK PHOS: 231 U/L — AB (ref 39–117)
ALT: 26 U/L (ref 0–53)
AST: 161 U/L — ABNORMAL HIGH (ref 0–37)
BUN: 7 mg/dL (ref 6–23)
CO2: 21 mEq/L (ref 19–32)
Calcium: 9 mg/dL (ref 8.4–10.5)
Chloride: 103 mEq/L (ref 96–112)
Creatinine, Ser: 0.85 mg/dL (ref 0.50–1.35)
GFR calc Af Amer: >90 mL/min (ref >90)
GFR calc non Af Amer: >90 mL/min (ref >90)
Glucose, Bld: 109 mg/dL — ABNORMAL HIGH (ref 70–99)
POTASSIUM: 4.3 meq/L (ref 3.7–5.3)
SODIUM: 141 meq/L (ref 137–147)
TOTAL PROTEIN: 8 g/dL (ref 6.0–8.3)
Total Bilirubin: 0.5 mg/dL (ref 0.3–1.2)

## 2013-11-29 LAB — CBC WITH DIFFERENTIAL/PLATELET
BASOS ABS: 0.1 10*3/uL (ref 0.0–0.1)
BASOS PCT: 1 % (ref 0–1)
EOS ABS: 0 10*3/uL (ref 0.0–0.7)
Eosinophils Relative: 1 % (ref 0–5)
HCT: 40 % (ref 39.0–52.0)
Hemoglobin: 14.4 g/dL (ref 13.0–17.0)
Lymphocytes Relative: 47 % — ABNORMAL HIGH (ref 12–46)
Lymphs Abs: 3.8 10*3/uL (ref 0.7–4.0)
MCH: 36.3 pg — AB (ref 26.0–34.0)
MCHC: 36 g/dL (ref 30.0–36.0)
MCV: 100.8 fL — ABNORMAL HIGH (ref 78.0–100.0)
Monocytes Absolute: 0.6 10*3/uL (ref 0.1–1.0)
Monocytes Relative: 7 % (ref 3–12)
NEUTROS PCT: 44 % (ref 43–77)
Neutro Abs: 3.4 10*3/uL (ref 1.7–7.7)
PLATELETS: 210 10*3/uL (ref 150–400)
RBC: 3.97 MIL/uL — ABNORMAL LOW (ref 4.22–5.81)
RDW: 13.5 % (ref 11.5–15.5)
WBC: 7.8 10*3/uL (ref 4.0–10.5)

## 2013-11-29 LAB — RAPID URINE DRUG SCREEN, HOSP PERFORMED
AMPHETAMINES: NOT DETECTED
BENZODIAZEPINES: NOT DETECTED
Barbiturates: NOT DETECTED
Cocaine: NOT DETECTED
Opiates: NOT DETECTED
Tetrahydrocannabinol: NOT DETECTED

## 2013-11-29 LAB — ETHANOL: ALCOHOL ETHYL (B): 367 mg/dL — AB (ref 0–11)

## 2013-11-29 SURGERY — IRRIGATION AND DEBRIDEMENT EXTREMITY
Anesthesia: General | Site: Hand | Laterality: Right

## 2013-11-29 MED ORDER — HYDROCODONE-ACETAMINOPHEN 5-325 MG PO TABS: 1.0000 | ORAL_TABLET | ORAL | Status: DC | PRN

## 2013-11-29 MED ORDER — PROMETHAZINE HCL 25 MG/ML IJ SOLN: 6.2500 mg | INTRAMUSCULAR | Status: DC | PRN

## 2013-11-29 MED ORDER — ONDANSETRON HCL 4 MG/2ML IJ SOLN
INTRAMUSCULAR | Status: DC | PRN
  Administered 2013-11-29: 4 mg via INTRAVENOUS

## 2013-11-29 MED ORDER — VITAMIN C 500 MG PO TABS
1000.0000 mg | ORAL_TABLET | Freq: Every day | ORAL | Status: DC
  Administered 2013-11-30: 1000 mg via ORAL
  Filled 2013-11-29: qty 2

## 2013-11-29 MED ORDER — PROPOFOL 10 MG/ML IV BOLUS
INTRAVENOUS | Status: AC
  Filled 2013-11-29: qty 20

## 2013-11-29 MED ORDER — SUCCINYLCHOLINE CHLORIDE 20 MG/ML IJ SOLN
INTRAMUSCULAR | Status: DC | PRN
  Administered 2013-11-29: 100 mg via INTRAVENOUS

## 2013-11-29 MED ORDER — METHOCARBAMOL 500 MG PO TABS
500.0000 mg | ORAL_TABLET | Freq: Four times a day (QID) | ORAL | Status: DC | PRN
  Administered 2013-11-29: 500 mg via ORAL

## 2013-11-29 MED ORDER — KCL IN DEXTROSE-NACL 20-5-0.45 MEQ/L-%-% IV SOLN
INTRAVENOUS | Status: DC
  Filled 2013-11-29 (×3): qty 1000

## 2013-11-29 MED ORDER — DEXTROSE 5 % IV SOLN: 500.0000 mg | Freq: Four times a day (QID) | INTRAVENOUS | Status: DC | PRN

## 2013-11-29 MED ORDER — OXYCODONE HCL 5 MG PO TABS
ORAL_TABLET | ORAL | Status: AC
  Filled 2013-11-29: qty 1

## 2013-11-29 MED ORDER — MIDAZOLAM HCL 2 MG/2ML IJ SOLN: 0.5000 mg | Freq: Once | INTRAMUSCULAR | Status: DC | PRN

## 2013-11-29 MED ORDER — LIDOCAINE HCL (CARDIAC) 20 MG/ML IV SOLN
INTRAVENOUS | Status: DC | PRN
  Administered 2013-11-29: 100 mg via INTRAVENOUS

## 2013-11-29 MED ORDER — OXYCODONE-ACETAMINOPHEN 10-325 MG PO TABS: 1.0000 | ORAL_TABLET | ORAL | Status: DC | PRN

## 2013-11-29 MED ORDER — SODIUM CHLORIDE 0.9 % IR SOLN
Status: DC | PRN
  Administered 2013-11-29: 3000 mL

## 2013-11-29 MED ORDER — FENTANYL CITRATE 0.05 MG/ML IJ SOLN
INTRAMUSCULAR | Status: AC
  Filled 2013-11-29: qty 5

## 2013-11-29 MED ORDER — HYDROMORPHONE HCL PF 1 MG/ML IJ SOLN
INTRAMUSCULAR | Status: AC
  Filled 2013-11-29: qty 1

## 2013-11-29 MED ORDER — ONDANSETRON HCL 4 MG/2ML IJ SOLN: 4.0000 mg | Freq: Four times a day (QID) | INTRAMUSCULAR | Status: DC | PRN

## 2013-11-29 MED ORDER — CEFAZOLIN SODIUM-DEXTROSE 2-3 GM-% IV SOLR
INTRAVENOUS | Status: DC | PRN
  Administered 2013-11-29: 2 g via INTRAVENOUS

## 2013-11-29 MED ORDER — DIPHENHYDRAMINE HCL 25 MG PO CAPS: 25.0000 mg | ORAL_CAPSULE | Freq: Four times a day (QID) | ORAL | Status: DC | PRN

## 2013-11-29 MED ORDER — MIDAZOLAM HCL 2 MG/2ML IJ SOLN
INTRAMUSCULAR | Status: AC
  Filled 2013-11-29: qty 2

## 2013-11-29 MED ORDER — ONDANSETRON HCL 4 MG PO TABS: 4.0000 mg | ORAL_TABLET | Freq: Four times a day (QID) | ORAL | Status: DC | PRN

## 2013-11-29 MED ORDER — HYDROMORPHONE HCL PF 1 MG/ML IJ SOLN
0.2500 mg | INTRAMUSCULAR | Status: DC | PRN
  Administered 2013-11-29 (×2): 0.5 mg via INTRAVENOUS

## 2013-11-29 MED ORDER — MEPERIDINE HCL 25 MG/ML IJ SOLN: 6.2500 mg | INTRAMUSCULAR | Status: DC | PRN

## 2013-11-29 MED ORDER — PROPOFOL 10 MG/ML IV BOLUS
INTRAVENOUS | Status: DC | PRN
  Administered 2013-11-29: 150 mg via INTRAVENOUS
  Administered 2013-11-29: 50 mg via INTRAVENOUS

## 2013-11-29 MED ORDER — LACTATED RINGERS IV SOLN
INTRAVENOUS | Status: DC | PRN
  Administered 2013-11-29: 21:00:00 via INTRAVENOUS

## 2013-11-29 MED ORDER — ZOLPIDEM TARTRATE 5 MG PO TABS: 5.0000 mg | ORAL_TABLET | Freq: Every evening | ORAL | Status: DC | PRN

## 2013-11-29 MED ORDER — AMOXICILLIN-POT CLAVULANATE 875-125 MG PO TABS: 1.0000 | ORAL_TABLET | Freq: Two times a day (BID) | ORAL | Status: DC

## 2013-11-29 MED ORDER — ALPRAZOLAM 0.5 MG PO TABS: 0.5000 mg | ORAL_TABLET | Freq: Four times a day (QID) | ORAL | Status: DC | PRN

## 2013-11-29 MED ORDER — OXYCODONE HCL 5 MG PO TABS
5.0000 mg | ORAL_TABLET | Freq: Once | ORAL | Status: AC | PRN
  Administered 2013-11-29: 5 mg via ORAL

## 2013-11-29 MED ORDER — SODIUM CHLORIDE 0.9 % IV SOLN
3.0000 g | Freq: Four times a day (QID) | INTRAVENOUS | Status: DC
  Administered 2013-11-29 – 2013-11-30 (×4): 3 g via INTRAVENOUS
  Filled 2013-11-29 (×6): qty 3

## 2013-11-29 MED ORDER — DOCUSATE SODIUM 100 MG PO CAPS
100.0000 mg | ORAL_CAPSULE | Freq: Two times a day (BID) | ORAL | Status: DC
  Administered 2013-11-30: 100 mg via ORAL
  Filled 2013-11-29 (×2): qty 1

## 2013-11-29 MED ORDER — OXYCODONE-ACETAMINOPHEN 5-325 MG PO TABS
1.0000 | ORAL_TABLET | ORAL | Status: DC | PRN
  Administered 2013-11-29 – 2013-11-30 (×2): 2 via ORAL
  Filled 2013-11-29 (×3): qty 2

## 2013-11-29 MED ORDER — MORPHINE SULFATE 2 MG/ML IJ SOLN: 1.0000 mg | INTRAMUSCULAR | Status: DC | PRN

## 2013-11-29 MED ORDER — METHOCARBAMOL 500 MG PO TABS
ORAL_TABLET | ORAL | Status: AC
  Filled 2013-11-29: qty 1

## 2013-11-29 MED ORDER — OXYCODONE HCL 5 MG/5ML PO SOLN: 5.0000 mg | Freq: Once | ORAL | Status: AC | PRN

## 2013-11-29 MED ORDER — KCL IN DEXTROSE-NACL 20-5-0.45 MEQ/L-%-% IV SOLN
INTRAVENOUS | Status: AC
  Filled 2013-11-29: qty 1000

## 2013-11-29 SURGICAL SUPPLY — 62 items
BANDAGE CONFORM 2  STR LF (GAUZE/BANDAGES/DRESSINGS) IMPLANT
BANDAGE ELASTIC 3 VELCRO ST LF (GAUZE/BANDAGES/DRESSINGS) ×3 IMPLANT
BANDAGE ELASTIC 4 VELCRO ST LF (GAUZE/BANDAGES/DRESSINGS) IMPLANT
BANDAGE GAUZE ELAST BULKY 4 IN (GAUZE/BANDAGES/DRESSINGS) ×3 IMPLANT
BNDG COHESIVE 1X5 TAN STRL LF (GAUZE/BANDAGES/DRESSINGS) IMPLANT
BNDG ELASTIC 2 VLCR STRL LF (GAUZE/BANDAGES/DRESSINGS) ×3 IMPLANT
BNDG ESMARK 4X9 LF (GAUZE/BANDAGES/DRESSINGS) IMPLANT
CORDS BIPOLAR (ELECTRODE) ×3 IMPLANT
COVER SURGICAL LIGHT HANDLE (MISCELLANEOUS) ×3 IMPLANT
CUFF TOURNIQUET SINGLE 18IN (TOURNIQUET CUFF) IMPLANT
CUFF TOURNIQUET SINGLE 24IN (TOURNIQUET CUFF) IMPLANT
DRAIN PENROSE 1/4X12 LTX STRL (WOUND CARE) IMPLANT
DRAPE SURG 17X23 STRL (DRAPES) ×3 IMPLANT
DRSG ADAPTIC 3X8 NADH LF (GAUZE/BANDAGES/DRESSINGS) IMPLANT
DRSG EMULSION OIL 3X3 NADH (GAUZE/BANDAGES/DRESSINGS) ×3 IMPLANT
ELECT REM PT RETURN 9FT ADLT (ELECTROSURGICAL)
ELECTRODE REM PT RTRN 9FT ADLT (ELECTROSURGICAL) IMPLANT
GAUZE XEROFORM 1X8 LF (GAUZE/BANDAGES/DRESSINGS) IMPLANT
GAUZE XEROFORM 5X9 LF (GAUZE/BANDAGES/DRESSINGS) IMPLANT
GLOVE BIOGEL PI IND STRL 8.5 (GLOVE) ×1 IMPLANT
GLOVE BIOGEL PI INDICATOR 8.5 (GLOVE) ×2
GLOVE SURG ORTHO 8.0 STRL STRW (GLOVE) ×3 IMPLANT
GOWN STRL REUS W/ TWL LRG LVL3 (GOWN DISPOSABLE) ×1 IMPLANT
GOWN STRL REUS W/ TWL XL LVL3 (GOWN DISPOSABLE) ×1 IMPLANT
GOWN STRL REUS W/TWL LRG LVL3 (GOWN DISPOSABLE) ×2
GOWN STRL REUS W/TWL XL LVL3 (GOWN DISPOSABLE) ×2
HANDPIECE INTERPULSE COAX TIP (DISPOSABLE)
KIT BASIN OR (CUSTOM PROCEDURE TRAY) ×3 IMPLANT
KIT ROOM TURNOVER OR (KITS) ×3 IMPLANT
MANIFOLD NEPTUNE II (INSTRUMENTS) IMPLANT
NEEDLE HYPO 25GX1X1/2 BEV (NEEDLE) IMPLANT
NS IRRIG 1000ML POUR BTL (IV SOLUTION) ×3 IMPLANT
PACK ORTHO EXTREMITY (CUSTOM PROCEDURE TRAY) ×3 IMPLANT
PAD ARMBOARD 7.5X6 YLW CONV (MISCELLANEOUS) ×6 IMPLANT
PAD CAST 3X4 CTTN HI CHSV (CAST SUPPLIES) ×2 IMPLANT
PAD CAST 4YDX4 CTTN HI CHSV (CAST SUPPLIES) IMPLANT
PADDING CAST COTTON 3X4 STRL (CAST SUPPLIES) ×4
PADDING CAST COTTON 4X4 STRL (CAST SUPPLIES)
PADDING CAST SYNTHETIC 2 (CAST SUPPLIES) ×2
PADDING CAST SYNTHETIC 2X4 NS (CAST SUPPLIES) ×1 IMPLANT
SET HNDPC FAN SPRY TIP SCT (DISPOSABLE) IMPLANT
SOAP 2 % CHG 4 OZ (WOUND CARE) ×3 IMPLANT
SPLINT FIBERGLASS 3X12 (CAST SUPPLIES) ×3 IMPLANT
SPONGE GAUZE 4X4 12PLY (GAUZE/BANDAGES/DRESSINGS) IMPLANT
SPONGE GAUZE 4X4 12PLY STER LF (GAUZE/BANDAGES/DRESSINGS) ×3 IMPLANT
SPONGE LAP 18X18 X RAY DECT (DISPOSABLE) IMPLANT
SPONGE LAP 4X18 X RAY DECT (DISPOSABLE) IMPLANT
SUCTION FRAZIER TIP 10 FR DISP (SUCTIONS) IMPLANT
SUT ETHILON 4 0 PS 2 18 (SUTURE) IMPLANT
SUT ETHILON 5 0 P 3 18 (SUTURE)
SUT MNCRL AB 3-0 PS2 18 (SUTURE) ×3 IMPLANT
SUT NYLON ETHILON 5-0 P-3 1X18 (SUTURE) IMPLANT
SUT PROLENE 4 0 PS 2 18 (SUTURE) ×3 IMPLANT
SYR CONTROL 10ML LL (SYRINGE) IMPLANT
TOWEL OR 17X24 6PK STRL BLUE (TOWEL DISPOSABLE) ×3 IMPLANT
TOWEL OR 17X26 10 PK STRL BLUE (TOWEL DISPOSABLE) ×3 IMPLANT
TUBE ANAEROBIC SPECIMEN COL (MISCELLANEOUS) ×3 IMPLANT
TUBE CONNECTING 12'X1/4 (SUCTIONS) ×1
TUBE CONNECTING 12X1/4 (SUCTIONS) ×2 IMPLANT
UNDERPAD 30X30 INCONTINENT (UNDERPADS AND DIAPERS) ×3 IMPLANT
WATER STERILE IRR 1000ML POUR (IV SOLUTION) IMPLANT
YANKAUER SUCT BULB TIP NO VENT (SUCTIONS) IMPLANT
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
## 2013-11-29 NOTE — ED Notes (Signed)
Attempt times 3 by 2 different nurses and unable to establish IV.  Instructed patient that he had to be honest with the OR team regarding any drugs or ETOH that he has used.  Voiced understanding.  Smell of ETOH when close enough to start the IV.  Patient transported to the OR via wheelchair.  All belongings in 2 patient belongings bag.  Cell phone in the pocket of the pants along with other items.

## 2013-11-29 NOTE — Anesthesia Postprocedure Evaluation (Signed)
  Anesthesia Post-op Note  Patient: Dennis Bailey  Procedure(s) Performed: Procedure(s): Incision and Drainage Right Hand (Right)  Patient Location: PACU  Anesthesia Type:General  Level of Consciousness: awake, alert , oriented and patient cooperative  Airway and Oxygen Therapy: Patient Spontanous Breathing and Patient connected to nasal cannula oxygen  Post-op Pain: mild  Post-op Assessment: Post-op Vital signs reviewed, Patient's Cardiovascular Status Stable, Respiratory Function Stable, Patent Airway, No signs of Nausea or vomiting and Pain level controlled  Post-op Vital Signs: Reviewed and stable  Last Vitals:  Filed Vitals:   11/29/13 2230  BP: 151/96  Pulse: 91  Temp:   Resp: 20    Complications: No apparent anesthesia complications
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
## 2013-11-29 NOTE — Anesthesia Preprocedure Evaluation (Signed)
Anesthesia Evaluation  Patient identified by MRN, date of birth, ID band Patient awake    Reviewed: Allergy & Precautions, H&P , Patient's Chart, lab work & pertinent test results  History of Anesthesia Complications Negative for: history of anesthetic complications  Airway Mallampati: I TM Distance: >3 FB Neck ROM: Full    Dental  (+) Poor Dentition, Missing, Chipped, Dental Advisory Given   Pulmonary Current Smoker,  breath sounds clear to auscultation        Cardiovascular negative cardio ROS  Rhythm:Regular Rate:Normal     Neuro/Psych negative neurological ROS     GI/Hepatic GERD-  Poorly Controlled,(+)     substance abuse  alcohol use,   Endo/Other  negative endocrine ROS  Renal/GU negative Renal ROS     Musculoskeletal   Abdominal   Peds  Hematology   Anesthesia Other Findings   Reproductive/Obstetrics                           Anesthesia Physical Anesthesia Plan  ASA: II and emergent  Anesthesia Plan: General   Post-op Pain Management:    Induction: Intravenous, Rapid sequence and Cricoid pressure planned  Airway Management Planned: Oral ETT  Additional Equipment:   Intra-op Plan:   Post-operative Plan: Extubation in OR  Informed Consent: I have reviewed the patients History and Physical, chart, labs and discussed the procedure including the risks, benefits and alternatives for the proposed anesthesia with the patient or authorized representative who has indicated his/her understanding and acceptance.   Dental advisory given  Plan Discussed with: CRNA and Surgeon  Anesthesia Plan Comments: (Plan routine monitors, GETA )        Anesthesia Quick Evaluation
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
## 2013-11-29 NOTE — Transfer of Care (Signed)
Immediate Anesthesia Transfer of Care Note  Patient: Dennis Bailey  Procedure(s) Performed: Procedure(s): Incision and Drainage Right Hand (Right)  Patient Location: PACU  Anesthesia Type:General  Level of Consciousness: awake, alert  and oriented  Airway & Oxygen Therapy: Patient Spontanous Breathing and Patient connected to nasal cannula oxygen  Post-op Assessment: Report given to PACU RN, Post -op Vital signs reviewed and stable and Patient moving all extremities  Post vital signs: Reviewed and stable  Complications: No apparent anesthesia complications

## 2013-11-29 NOTE — Discharge Instructions (Signed)
KEEP BANDAGE CLEAN AND DRY CALL OFFICE FOR F/U APPT [phone] ON Monday June 8TH IN PM KEEP HAND ELEVATED ABOVE HEART OK TO APPLY ICE TO OPERATIVE AREA CONTACT OFFICE IF ANY WORSENING PAIN OR CONCERNS.

## 2013-11-30 MED ORDER — CHLORHEXIDINE GLUCONATE 4 % EX LIQD
60.0000 mL | Freq: Once | CUTANEOUS | Status: DC
  Filled 2013-11-30: qty 60

## 2013-11-30 NOTE — Progress Notes (Signed)
Patient asked to leave unit to smoke.  Patient informed that they would not be able to leave premises without written order from MD.  Patient informed that on call could be contacted for a nicotine patch, if needed; patient declined.  Patient then stated that he would "sneak outside to smoke." Patient encouraged to remain within hospital unit. Patient also informed that smoking was not allowed within the hospital room due to hospital policy and safety concerns.  Patient and patient's significant other present at the time of conversation.  Both parties acknowledged their understanding of the information shared.
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
## 2013-11-30 NOTE — ED Provider Notes (Signed)
Medical screening examination/treatment/procedure(s) were performed by non-physician practitioner and as supervising physician I was immediately available for consultation/collaboration.   EKG Interpretation None        Noel Rodier S Rindy Kollman, MD 11/30/13 1112 
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
## 2013-11-30 NOTE — Progress Notes (Signed)
Patient discharged to home accompanied by family. Discharge instructions and rx given and explained and patient stated understanding. IV was removed and patient left unit in a stable condition with all personal belongings.
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
## 2013-11-30 NOTE — ED Provider Notes (Signed)
Medical screening examination/treatment/procedure(s) were performed by non-physician practitioner and as supervising physician I was immediately available for consultation/collaboration.   EKG Interpretation None        Junius Argyle, MD 11/30/13 1112

## 2013-12-01 ENCOUNTER — Encounter (HOSPITAL_COMMUNITY): Payer: Self-pay | Admitting: Orthopedic Surgery

## 2013-12-03 LAB — CULTURE, ROUTINE-ABSCESS
CULTURE: NO GROWTH
GRAM STAIN: NONE SEEN

## 2013-12-04 LAB — ANAEROBIC CULTURE: GRAM STAIN: NONE SEEN

## 2014-02-16 ENCOUNTER — Encounter (HOSPITAL_COMMUNITY): Payer: Self-pay | Admitting: Emergency Medicine

## 2014-02-16 ENCOUNTER — Emergency Department (HOSPITAL_COMMUNITY)
Admission: EM | Admit: 2014-02-16 | Discharge: 2014-02-16 | Disposition: A | Discharge status: Home / Self Care | Payer: Self-pay | Attending: Emergency Medicine | Admitting: Emergency Medicine

## 2014-02-16 DIAGNOSIS — M79609 Pain in unspecified limb: Secondary | ICD-10-CM | POA: Insufficient documentation

## 2014-02-16 DIAGNOSIS — B353 Tinea pedis: Secondary | ICD-10-CM

## 2014-02-16 DIAGNOSIS — L84 Corns and callosities: Secondary | ICD-10-CM

## 2014-02-16 DIAGNOSIS — F17200 Nicotine dependence, unspecified, uncomplicated: Secondary | ICD-10-CM | POA: Insufficient documentation

## 2014-02-16 MED ORDER — CLOTRIMAZOLE 1 % EX CREA: TOPICAL_CREAM | CUTANEOUS | Status: AC

## 2014-02-16 NOTE — Discharge Instructions (Signed)
Corns and Calluses Corns are small areas of thickened skin that usually occur on the top, sides, or tip of a toe. They contain a cone-shaped core with a point that can press on a nerve below. This causes pain. Calluses are areas of thickened skin that usually develop on hands, fingers, palms, soles of the feet, and heels. These are areas that experience frequent friction or pressure. CAUSES  Corns are usually the result of rubbing (friction) or pressure from shoes that are too tight or do not fit properly. Calluses are caused by repeated friction and pressure on the affected areas. SYMPTOMS  A hard growth on the skin.  Pain or tenderness under the skin.  Sometimes, redness and swelling.  Increased discomfort while wearing tight-fitting shoes. DIAGNOSIS  Your caregiver can usually tell what the problem is by doing a physical exam. TREATMENT  Removing the cause of the friction or pressure is usually the only treatment needed. However, sometimes medicines can be used to help soften the hardened, thickened areas. These medicines include salicylic acid plasters and 12% ammonium lactate lotion. These medicines should only be used under the direction of your caregiver. HOME CARE INSTRUCTIONS   Try to remove pressure from the affected area.  You may wear donut-shaped corn pads to protect your skin.  You may use salicylic acid plasters (available without a prescription) as directed on package.  You may use a pumice stone or nonmetallic nail file to gently reduce the thickness of a corn.  Wear properly fitted footwear.  If you have calluses on the hands, wear gloves during activities that cause friction.  If you have diabetes, you should regularly examine your feet. Tell your caregiver if you notice any problems with your feet. SEEK IMMEDIATE MEDICAL CARE IF:   You have increased pain, swelling, redness, or warmth in the affected area.  Your corn or callus starts to drain fluid or  bleeds.  You are not getting better, even with treatment. Document Released: 03/21/2004 Document Revised: 09/07/2011 Document Reviewed: 02/10/2011 [facility] Patient Information 2015 [place], [place]. This information is not intended to replace advice given to you by your health care provider. Make sure you discuss any questions you have with your health care provider.  Athlete's Foot Athlete's foot (tinea pedis) is a fungal infection of the skin on the feet. It often occurs on the skin between the toes or underneath the toes. It can also occur on the soles of the feet. Athlete's foot is more likely to occur in hot, humid weather. Not washing your feet or changing your socks often enough can contribute to athlete's foot. The infection can spread from person to person (contagious). CAUSES Athlete's foot is caused by a fungus. This fungus thrives in warm, moist places. Most people get athlete's foot by sharing shower stalls, towels, and wet floors with an infected person. People with weakened immune systems, including those with diabetes, may be more likely to get athlete's foot. SYMPTOMS   Itchy areas between the toes or on the soles of the feet.  White, flaky, or scaly areas between the toes or on the soles of the feet.  Tiny, intensely itchy blisters between the toes or on the soles of the feet.  Tiny cuts on the skin. These cuts can develop a bacterial infection.  Thick or discolored toenails. DIAGNOSIS  Your caregiver can usually tell what the problem is by doing a physical exam. Your caregiver may also take a skin sample from the rash area. The skin  sample may be examined under a microscope, or it may be tested to see if fungus will grow in the sample. A sample may also be taken from your toenail for testing. TREATMENT  Over-the-counter and prescription medicines can be used to kill the fungus. These medicines are available as powders or creams. Your caregiver can suggest medicines for you.  Fungal infections respond slowly to treatment. You may need to continue using your medicine for several weeks. PREVENTION   Do not share towels.  Wear sandals in wet areas, such as shared locker rooms and shared showers.  Keep your feet dry. Wear shoes that allow air to circulate. Wear cotton or wool socks. HOME CARE INSTRUCTIONS   Take medicines as directed by your caregiver. Do not use steroid creams on athlete's foot.  Keep your feet clean and cool. Wash your feet daily and dry them thoroughly, especially between your toes.  Change your socks every day. Wear cotton or wool socks. In hot climates, you may need to change your socks 2 to 3 times per day.  Wear sandals or canvas tennis shoes with good air circulation.  If you have blisters, soak your feet in Burow's solution or Epsom salts for 20 to 30 minutes, 2 times a day to dry out the blisters. Make sure you dry your feet thoroughly afterward. SEEK MEDICAL CARE IF:   You have a fever.  You have swelling, soreness, warmth, or redness in your foot.  You are not getting better after 7 days of treatment.  You are not completely cured after 30 days.  You have any problems caused by your medicines. MAKE SURE YOU:   Understand these instructions.  Will watch your condition.  Will get help right away if you are not doing well or get worse. Document Released: 06/12/2000 Document Revised: 09/07/2011 Document Reviewed: 04/03/2011 [facility] Patient Information 2015 [place], [place]. This information is not intended to replace advice given to you by your health care provider. Make sure you discuss any questions you have with your health care provider.
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
## 2014-02-16 NOTE — ED Notes (Signed)
Pt. Refused wheelchair 

## 2014-02-16 NOTE — ED Notes (Signed)
Pt c/o right foot pain with "calus per pt"; pt sts painful to walk

## 2014-02-27 NOTE — ED Provider Notes (Signed)
Medical screening examination/treatment/procedure(s) were performed by non-physician practitioner and as supervising physician I was immediately available for consultation/collaboration.   EKG Interpretation None       Trinia Georgi M Mishika Flippen, MD 02/27/14 1521 

## 2014-07-27 ENCOUNTER — Emergency Department (HOSPITAL_COMMUNITY)
Admission: EM | Admit: 2014-07-27 | Discharge: 2014-07-27 | Disposition: A | Discharge status: Home / Self Care | Payer: Self-pay | Attending: Emergency Medicine | Admitting: Emergency Medicine

## 2014-07-27 ENCOUNTER — Emergency Department (HOSPITAL_COMMUNITY): Payer: Self-pay

## 2014-07-27 ENCOUNTER — Encounter (HOSPITAL_COMMUNITY): Payer: Self-pay

## 2014-07-27 DIAGNOSIS — Z87828 Personal history of other (healed) physical injury and trauma: Secondary | ICD-10-CM | POA: Insufficient documentation

## 2014-07-27 DIAGNOSIS — R0789 Other chest pain: Secondary | ICD-10-CM

## 2014-07-27 DIAGNOSIS — Z79899 Other long term (current) drug therapy: Secondary | ICD-10-CM | POA: Insufficient documentation

## 2014-07-27 DIAGNOSIS — Y9389 Activity, other specified: Secondary | ICD-10-CM | POA: Insufficient documentation

## 2014-07-27 DIAGNOSIS — Y9289 Other specified places as the place of occurrence of the external cause: Secondary | ICD-10-CM | POA: Insufficient documentation

## 2014-07-27 DIAGNOSIS — R0781 Pleurodynia: Secondary | ICD-10-CM

## 2014-07-27 DIAGNOSIS — Y998 Other external cause status: Secondary | ICD-10-CM | POA: Insufficient documentation

## 2014-07-27 DIAGNOSIS — S20212A Contusion of left front wall of thorax, initial encounter: Secondary | ICD-10-CM | POA: Insufficient documentation

## 2014-07-27 DIAGNOSIS — Z720 Tobacco use: Secondary | ICD-10-CM | POA: Insufficient documentation

## 2014-07-27 DIAGNOSIS — W009XXA Unspecified fall due to ice and snow, initial encounter: Secondary | ICD-10-CM | POA: Insufficient documentation

## 2014-07-27 MED ORDER — HYDROCODONE-ACETAMINOPHEN 5-325 MG PO TABS: 1.0000 | ORAL_TABLET | Freq: Four times a day (QID) | ORAL | Status: AC | PRN

## 2014-07-27 MED ORDER — METHOCARBAMOL 500 MG PO TABS: 500.0000 mg | ORAL_TABLET | Freq: Two times a day (BID) | ORAL | Status: DC

## 2014-07-27 MED ORDER — METHOCARBAMOL 500 MG PO TABS
500.0000 mg | ORAL_TABLET | Freq: Once | ORAL | Status: AC
  Administered 2014-07-27: 500 mg via ORAL
  Filled 2014-07-27: qty 1

## 2014-07-27 MED ORDER — HYDROCODONE-ACETAMINOPHEN 5-325 MG PO TABS
1.0000 | ORAL_TABLET | Freq: Once | ORAL | Status: AC
  Administered 2014-07-27: 1 via ORAL
  Filled 2014-07-27: qty 1

## 2014-07-27 NOTE — ED Notes (Signed)
Pt slipped and fell Sunday on ice and has pain to the left chest area. States he had a chest tube there at one point and that is the same spot that hurts.

## 2014-07-27 NOTE — ED Notes (Signed)
Pt states that he has a ride home.

## 2014-07-27 NOTE — Discharge Instructions (Signed)
1. Medications: vicodin, robaxin, usual home medications 2. Treatment: rest, drink plenty of fluids, incentive spirometry 3. Follow Up: Please followup with your primary doctor in 7 days for discussion of your diagnoses and further evaluation after today's visit; if you do not have a primary care doctor use the resource guide provided to find one; Please return to the ER for worsening symptoms, increasing SOB   Blunt Chest Trauma Blunt chest trauma is an injury caused by a blow to the chest. These chest injuries can be very painful. Blunt chest trauma often results in bruised or broken (fractured) ribs. Most cases of bruised and fractured ribs from blunt chest traumas get better after 1 to 3 weeks of rest and pain medicine. Often, the soft tissue in the chest wall is also injured, causing pain and bruising. Internal organs, such as the heart and lungs, may also be injured. Blunt chest trauma can lead to serious medical problems. This injury requires immediate medical care. CAUSES   Motor vehicle collisions.  Falls.  Physical violence.  Sports injuries. SYMPTOMS   Chest pain. The pain may be worse when you move or breathe deeply.  Shortness of breath.  Lightheadedness.  Bruising.  Tenderness.  Swelling. DIAGNOSIS  Your caregiver will do a physical exam. X-rays may be taken to look for fractures. However, minor rib fractures may not show up on X-rays until a few days after the injury. If a more serious injury is suspected, further imaging tests may be done. This may include ultrasounds, computed tomography (CT) scans, or magnetic resonance imaging (MRI). TREATMENT  Treatment depends on the severity of your injury. Your caregiver may prescribe pain medicines and deep breathing exercises. HOME CARE INSTRUCTIONS  Limit your activities until you can move around without much pain.  Do not do any strenuous work until your injury is healed.  Put ice on the injured area.  Put ice in a  plastic bag.  Place a towel between your skin and the bag.  Leave the ice on for 15-20 minutes, 03-04 times a day.  You may wear a rib belt as directed by your caregiver to reduce pain.  Practice deep breathing as directed by your caregiver to keep your lungs clear.  Only take over-the-counter or prescription medicines for pain, fever, or discomfort as directed by your caregiver. SEEK IMMEDIATE MEDICAL CARE IF:   You have increasing pain or shortness of breath.  You cough up blood.  You have nausea, vomiting, or abdominal pain.  You have a fever.  You feel dizzy, weak, or you faint. MAKE SURE YOU:  Understand these instructions.  Will watch your condition.  Will get help right away if you are not doing well or get worse. Document Released: 07/23/2004 Document Revised: 09/07/2011 Document Reviewed: 04/01/2011 [facility] Patient Information 2015 [place]. This information is not intended to replace advice given to you by your health care provider. Make sure you discuss any questions you have with your health care provider.
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
## 2014-08-08 ENCOUNTER — Ambulatory Visit: Payer: Self-pay | Attending: Internal Medicine | Admitting: Internal Medicine

## 2014-08-08 ENCOUNTER — Encounter: Payer: Self-pay | Admitting: Internal Medicine

## 2014-08-08 VITALS — BP 142/96 | HR 103 | Temp 98.4°F | Ht 74.0 in | Wt 173.2 lb

## 2014-08-08 DIAGNOSIS — R252 Cramp and spasm: Secondary | ICD-10-CM | POA: Insufficient documentation

## 2014-08-08 DIAGNOSIS — Z139 Encounter for screening, unspecified: Secondary | ICD-10-CM

## 2014-08-08 DIAGNOSIS — Z23 Encounter for immunization: Secondary | ICD-10-CM

## 2014-08-08 DIAGNOSIS — R030 Elevated blood-pressure reading, without diagnosis of hypertension: Secondary | ICD-10-CM

## 2014-08-08 DIAGNOSIS — IMO0001 Reserved for inherently not codable concepts without codable children: Secondary | ICD-10-CM

## 2014-08-08 DIAGNOSIS — F17210 Nicotine dependence, cigarettes, uncomplicated: Secondary | ICD-10-CM | POA: Insufficient documentation

## 2014-08-08 DIAGNOSIS — R0789 Other chest pain: Secondary | ICD-10-CM | POA: Insufficient documentation

## 2014-08-08 DIAGNOSIS — Z720 Tobacco use: Secondary | ICD-10-CM

## 2014-08-08 LAB — CBC WITH DIFFERENTIAL/PLATELET
BASOS PCT: 1 % (ref 0–1)
Basophils Absolute: 0.1 10*3/uL (ref 0.0–0.1)
Eosinophils Absolute: 0.1 10*3/uL (ref 0.0–0.7)
Eosinophils Relative: 2 % (ref 0–5)
HCT: 47.1 % (ref 39.0–52.0)
Hemoglobin: 16.6 g/dL (ref 13.0–17.0)
LYMPHS ABS: 3.7 10*3/uL (ref 0.7–4.0)
LYMPHS PCT: 55 % — AB (ref 12–46)
MCH: 34.4 pg — ABNORMAL HIGH (ref 26.0–34.0)
MCHC: 35.2 g/dL (ref 30.0–36.0)
MCV: 97.5 fL (ref 78.0–100.0)
MONO ABS: 0.6 10*3/uL (ref 0.1–1.0)
MPV: 12.5 fL — ABNORMAL HIGH (ref 8.6–12.4)
Monocytes Relative: 9 % (ref 3–12)
NEUTROS ABS: 2.2 10*3/uL (ref 1.7–7.7)
Neutrophils Relative %: 33 % — ABNORMAL LOW (ref 43–77)
Platelets: 91 10*3/uL — ABNORMAL LOW (ref 150–400)
RBC: 4.83 MIL/uL (ref 4.22–5.81)
RDW: 14.8 % (ref 11.5–15.5)
WBC: 6.7 10*3/uL (ref 4.0–10.5)

## 2014-08-08 LAB — COMPLETE METABOLIC PANEL WITH GFR
ALBUMIN: 4.4 g/dL (ref 3.5–5.2)
ALK PHOS: 259 U/L — AB (ref 39–117)
ALT: 38 U/L (ref 0–53)
AST: 230 U/L — ABNORMAL HIGH (ref 0–37)
BUN: 6 mg/dL (ref 6–23)
CALCIUM: 10.2 mg/dL (ref 8.4–10.5)
CO2: 23 mEq/L (ref 19–32)
CREATININE: 0.86 mg/dL (ref 0.50–1.35)
Chloride: 97 mEq/L (ref 96–112)
GFR, Est African American: >89 mL/min
GLUCOSE: 93 mg/dL (ref 70–99)
POTASSIUM: 5 meq/L (ref 3.5–5.3)
Sodium: 137 mEq/L (ref 135–145)
Total Bilirubin: 1.5 mg/dL — ABNORMAL HIGH (ref 0.2–1.2)
Total Protein: 8.1 g/dL (ref 6.0–8.3)

## 2014-08-08 LAB — LIPID PANEL
CHOL/HDL RATIO: 3.4 ratio
CHOLESTEROL: 162 mg/dL (ref 0–200)
HDL: 48 mg/dL (ref >39)
LDL Cholesterol: 87 mg/dL (ref 0–99)
TRIGLYCERIDES: 134 mg/dL (ref <150)
VLDL: 27 mg/dL (ref 0–40)

## 2014-08-08 MED ORDER — IBUPROFEN 800 MG PO TABS: 800.0000 mg | ORAL_TABLET | Freq: Three times a day (TID) | ORAL | Status: AC | PRN

## 2014-08-08 MED ORDER — METHOCARBAMOL 500 MG PO TABS
500.0000 mg | ORAL_TABLET | Freq: Two times a day (BID) | ORAL | Status: AC | PRN
Start: 2014-08-08 — End: ?

## 2014-08-08 MED ORDER — NICOTINE 21 MG/24HR TD PT24: 21.0000 mg | MEDICATED_PATCH | Freq: Every day | TRANSDERMAL | Status: AC
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
## 2014-08-08 NOTE — Progress Notes (Signed)
Patient is here to establish care.  Patient was recently in the ER due to a fall that caused left side rib pain.  Patient states he was advised it was not broken.  He states he is still having pain on left side of rib cage, rated 8 out of 10 today.  Pt denies any SOB at this time.

## 2014-08-08 NOTE — Patient Instructions (Addendum)
DASH Eating Plan DASH stands for "Dietary Approaches to Stop Hypertension." The DASH eating plan is a healthy eating plan that has been shown to reduce high blood pressure (hypertension). Additional health benefits may include reducing the risk of type 2 diabetes mellitus, heart disease, and stroke. The DASH eating plan may also help with weight loss. WHAT DO I NEED TO KNOW ABOUT THE DASH EATING PLAN? For the DASH eating plan, you will follow these general guidelines:  Choose foods with a percent daily value for sodium of less than 5% (as listed on the food label).  Use salt-free seasonings or herbs instead of table salt or sea salt.  Check with your health care provider or pharmacist before using salt substitutes.  Eat lower-sodium products, often labeled as "lower sodium" or "no salt added."  Eat fresh foods.  Eat more vegetables, fruits, and low-fat dairy products.  Choose whole grains. Look for the word "whole" as the first word in the ingredient list.  Choose fish and skinless chicken or turkey more often than red meat. Limit fish, poultry, and meat to 6 oz (170 g) each day.  Limit sweets, desserts, sugars, and sugary drinks.  Choose heart-healthy fats.  Limit cheese to 1 oz (28 g) per day.  Eat more home-cooked food and less restaurant, buffet, and fast food.  Limit fried foods.  Cook foods using methods other than frying.  Limit canned vegetables. If you do use them, rinse them well to decrease the sodium.  When eating at a restaurant, ask that your food be prepared with less salt, or no salt if possible. WHAT FOODS CAN I EAT? Seek help from a dietitian for individual calorie needs. Grains Whole grain or whole wheat bread. Brown rice. Whole grain or whole wheat pasta. Quinoa, bulgur, and whole grain cereals. Low-sodium cereals. Corn or whole wheat flour tortillas. Whole grain cornbread. Whole grain crackers. Low-sodium crackers. Vegetables Fresh or frozen vegetables  (raw, steamed, roasted, or grilled). Low-sodium or reduced-sodium tomato and vegetable juices. Low-sodium or reduced-sodium tomato sauce and paste. Low-sodium or reduced-sodium canned vegetables.  Fruits All fresh, canned (in natural juice), or frozen fruits. Meat and Other Protein Products Ground beef (85% or leaner), grass-fed beef, or beef trimmed of fat. Skinless chicken or turkey. Ground chicken or turkey. Pork trimmed of fat. All fish and seafood. Eggs. Dried beans, peas, or lentils. Unsalted nuts and seeds. Unsalted canned beans. Dairy Low-fat dairy products, such as skim or 1% milk, 2% or reduced-fat cheeses, low-fat ricotta or cottage cheese, or plain low-fat yogurt. Low-sodium or reduced-sodium cheeses. Fats and Oils Tub margarines without trans fats. Light or reduced-fat mayonnaise and salad dressings (reduced sodium). Avocado. Safflower, olive, or canola oils. Natural peanut or almond butter. Other Unsalted popcorn and pretzels. The items listed above may not be a complete list of recommended foods or beverages. Contact your dietitian for more options. WHAT FOODS ARE NOT RECOMMENDED? Grains White bread. White pasta. White rice. Refined cornbread. Bagels and croissants. Crackers that contain trans fat. Vegetables Creamed or fried vegetables. Vegetables in a cheese sauce. Regular canned vegetables. Regular canned tomato sauce and paste. Regular tomato and vegetable juices. Fruits Dried fruits. Canned fruit in light or heavy syrup. Fruit juice. Meat and Other Protein Products Fatty cuts of meat. Ribs, chicken wings, bacon, sausage, bologna, salami, chitterlings, fatback, hot dogs, bratwurst, and packaged luncheon meats. Salted nuts and seeds. Canned beans with salt. Dairy Whole or 2% milk, cream, half-and-half, and cream cheese. Whole-fat or sweetened yogurt. Full-fat   cheeses or blue cheese. Nondairy creamers and whipped toppings. Processed cheese, cheese spreads, or cheese  curds. Condiments Onion and garlic salt, seasoned salt, table salt, and sea salt. Canned and packaged gravies. Worcestershire sauce. Tartar sauce. Barbecue sauce. Teriyaki sauce. Soy sauce, including reduced sodium. Steak sauce. Fish sauce. Oyster sauce. Cocktail sauce. Horseradish. Ketchup and mustard. Meat flavorings and tenderizers. Bouillon cubes. Hot sauce. Tabasco sauce. Marinades. Taco seasonings. Relishes. Fats and Oils Butter, stick margarine, lard, shortening, ghee, and bacon fat. Coconut, palm kernel, or palm oils. Regular salad dressings. Other Pickles and olives. Salted popcorn and pretzels. The items listed above may not be a complete list of foods and beverages to avoid. Contact your dietitian for more information. WHERE CAN I FIND MORE INFORMATION? National Heart, Lung, and Blood Institute: www.nhlbi.nih.gov/health/health-topics/topics/dash/ Document Released: 06/04/2011 Document Revised: 10/30/2013 Document Reviewed: 04/19/2013 ExitCare Patient Information 2015 [employer]. This information is not intended to replace advice given to you by your health care provider. Make sure you discuss any questions you have with your health care provider. Smoking Cessation Quitting smoking is important to your health and has many advantages. However, it is not always easy to quit since nicotine is a very addictive drug. Oftentimes, people try 3 times or more before being able to quit. This document explains the best ways for you to prepare to quit smoking. Quitting takes hard work and a lot of effort, but you can do it. ADVANTAGES OF QUITTING SMOKING  You will live longer, feel better, and live better.  Your body will feel the impact of quitting smoking almost immediately.  Within 20 minutes, blood pressure decreases. Your pulse returns to its normal level.  After 8 hours, carbon monoxide levels in the blood return to normal. Your oxygen level increases.  After 24 hours, the chance of  having a heart attack starts to decrease. Your breath, hair, and body stop smelling like smoke.  After 48 hours, damaged nerve endings begin to recover. Your sense of taste and smell improve.  After 72 hours, the body is virtually free of nicotine. Your bronchial tubes relax and breathing becomes easier.  After 2 to 12 weeks, lungs can hold more air. Exercise becomes easier and circulation improves.  The risk of having a heart attack, stroke, cancer, or lung disease is greatly reduced.  After 1 year, the risk of coronary heart disease is cut in half.  After 5 years, the risk of stroke falls to the same as a nonsmoker.  After 10 years, the risk of lung cancer is cut in half and the risk of other cancers decreases significantly.  After 15 years, the risk of coronary heart disease drops, usually to the level of a nonsmoker.  If you are pregnant, quitting smoking will improve your chances of having a healthy baby.  The people you live with, especially any children, will be healthier.  You will have extra money to spend on things other than cigarettes. QUESTIONS TO THINK ABOUT BEFORE ATTEMPTING TO QUIT You may want to talk about your answers with your health care provider.  Why do you want to quit?  If you tried to quit in the past, what helped and what did not?  What will be the most difficult situations for you after you quit? How will you plan to handle them?  Who can help you through the tough times? Your family? Friends? A health care provider?  What pleasures do you get from smoking? What ways can you still get pleasure   if you quit? Here are some questions to ask your health care provider:  How can you help me to be successful at quitting?  What medicine do you think would be best for me and how should I take it?  What should I do if I need more help?  What is smoking withdrawal like? How can I get information on withdrawal? GET READY  Set a quit date.  Change your  environment by getting rid of all cigarettes, ashtrays, matches, and lighters in your home, car, or work. Do not let people smoke in your home.  Review your past attempts to quit. Think about what worked and what did not. GET SUPPORT AND ENCOURAGEMENT You have a better chance of being successful if you have help. You can get support in many ways.  Tell your family, friends, and coworkers that you are going to quit and need their support. Ask them not to smoke around you.  Get individual, group, or telephone counseling and support. Programs are available at local hospitals and health centers. Call your local health department for information about programs in your area.  Spiritual beliefs and practices may help some smokers quit.  Download a "quit meter" on your computer to keep track of quit statistics, such as how long you have gone without smoking, cigarettes not smoked, and money saved.  Get a self-help book about quitting smoking and staying off tobacco. LEARN NEW SKILLS AND BEHAVIORS  Distract yourself from urges to smoke. Talk to someone, go for a walk, or occupy your time with a task.  Change your normal routine. Take a different route to work. Drink tea instead of coffee. Eat breakfast in a different place.  Reduce your stress. Take a hot bath, exercise, or read a book.  Plan something enjoyable to do every day. Reward yourself for not smoking.  Explore interactive web-based programs that specialize in helping you quit. GET MEDICINE AND USE IT CORRECTLY Medicines can help you stop smoking and decrease the urge to smoke. Combining medicine with the above behavioral methods and support can greatly increase your chances of successfully quitting smoking.  Nicotine replacement therapy helps deliver nicotine to your body without the negative effects and risks of smoking. Nicotine replacement therapy includes nicotine gum, lozenges, inhalers, nasal sprays, and skin patches. Some may be  available over-the-counter and others require a prescription.  Antidepressant medicine helps people abstain from smoking, but how this works is unknown. This medicine is available by prescription.  Nicotinic receptor partial agonist medicine simulates the effect of nicotine in your brain. This medicine is available by prescription. Ask your health care provider for advice about which medicines to use and how to use them based on your health history. Your health care provider will tell you what side effects to look out for if you choose to be on a medicine or therapy. Carefully read the information on the package. Do not use any other product containing nicotine while using a nicotine replacement product.  RELAPSE OR DIFFICULT SITUATIONS Most relapses occur within the first 3 months after quitting. Do not be discouraged if you start smoking again. Remember, most people try several times before finally quitting. You may have symptoms of withdrawal because your body is used to nicotine. You may crave cigarettes, be irritable, feel very hungry, cough often, get headaches, or have difficulty concentrating. The withdrawal symptoms are only temporary. They are strongest when you first quit, but they will go away within 10-14 days. To reduce the   chances of relapse, try to:  Avoid drinking alcohol. Drinking lowers your chances of successfully quitting.  Reduce the amount of caffeine you consume. Once you quit smoking, the amount of caffeine in your body increases and can give you symptoms, such as a rapid heartbeat, sweating, and anxiety.  Avoid smokers because they can make you want to smoke.  Do not let weight gain distract you. Many smokers will gain weight when they quit, usually less than 10 pounds. Eat a healthy diet and stay active. You can always lose the weight gained after you quit.  Find ways to improve your mood other than smoking. FOR MORE INFORMATION  www.smokefree.gov  Document Released:  06/09/2001 Document Revised: 10/30/2013 Document Reviewed: 09/24/2011 ExitCare Patient Information 2015 [employer]. This information is not intended to replace advice given to you by your health care provider. Make sure you discuss any questions you have with your health care provider.  

## 2014-08-09 ENCOUNTER — Telehealth: Payer: Self-pay

## 2014-08-09 ENCOUNTER — Other Ambulatory Visit: Payer: Self-pay | Admitting: Internal Medicine

## 2014-08-09 DIAGNOSIS — R7989 Other specified abnormal findings of blood chemistry: Secondary | ICD-10-CM

## 2014-08-09 DIAGNOSIS — R945 Abnormal results of liver function studies: Secondary | ICD-10-CM

## 2014-08-09 DIAGNOSIS — R799 Abnormal finding of blood chemistry, unspecified: Secondary | ICD-10-CM

## 2014-08-09 LAB — HEMOGLOBIN A1C
Hgb A1c MFr Bld: 5.3 % (ref <5.7)
Mean Plasma Glucose: 105 mg/dL (ref <117)

## 2014-08-09 LAB — TSH: TSH: 2.655 u[IU]/mL (ref 0.350–4.500)

## 2014-08-09 LAB — VITAMIN D 25 HYDROXY (VIT D DEFICIENCY, FRACTURES): Vit D, 25-Hydroxy: 5 ng/mL — ABNORMAL LOW (ref 30–100)

## 2014-08-09 MED ORDER — VITAMIN D (ERGOCALCIFEROL) 1.25 MG (50000 UNIT) PO CAPS: 50000.0000 [IU] | ORAL_CAPSULE | ORAL | Status: AC

## 2014-08-09 NOTE — Telephone Encounter (Signed)
-----   Message from Doris Cheadleeepak Advani, MD sent at 08/09/2014  9:18 AM EST ----- Blood work reviewed, noticed low vitamin D, call patient advise to start ergocalciferol 50,000 units once a week for the duration of  12 weeks. Also noticed abnormal LFTs, I have ordered hepatitis panel, advise patient to do the blood work in a week time.

## 2014-08-09 NOTE — Telephone Encounter (Signed)
Spoke with patient and he is aware of his lab results Prescription sent to [facility] on [address]

## 2014-08-14 ENCOUNTER — Ambulatory Visit: Payer: Self-pay | Attending: Internal Medicine

## 2014-08-14 DIAGNOSIS — R945 Abnormal results of liver function studies: Secondary | ICD-10-CM

## 2014-08-14 DIAGNOSIS — R7989 Other specified abnormal findings of blood chemistry: Secondary | ICD-10-CM

## 2014-08-14 DIAGNOSIS — R799 Abnormal finding of blood chemistry, unspecified: Secondary | ICD-10-CM

## 2014-08-15 LAB — HEPATITIS B SURFACE ANTIGEN: HEP B S AG: NEGATIVE

## 2014-08-15 LAB — HEPATIC FUNCTION PANEL
ALBUMIN: 3.7 g/dL (ref 3.5–5.2)
ALK PHOS: 166 U/L — AB (ref 39–117)
ALT: 17 U/L (ref 0–53)
AST: 73 U/L — AB (ref 0–37)
BILIRUBIN INDIRECT: 0.4 mg/dL (ref 0.2–1.2)
Bilirubin, Direct: 0.1 mg/dL (ref 0.0–0.3)
Total Bilirubin: 0.5 mg/dL (ref 0.2–1.2)
Total Protein: 6.9 g/dL (ref 6.0–8.3)

## 2014-08-15 LAB — HEPATITIS C RNA QUANTITATIVE: HCV QUANT: NOT DETECTED [IU]/mL (ref <15)

## 2014-08-15 LAB — HEPATITIS C ANTIBODY: HCV Ab: NEGATIVE

## 2014-08-15 LAB — HEPATITIS B SURFACE ANTIBODY,QUALITATIVE: Hep B S Ab: NEGATIVE

## 2014-08-17 ENCOUNTER — Telehealth: Payer: Self-pay

## 2014-08-17 NOTE — Telephone Encounter (Signed)
-----   Message from Doris Cheadleeepak Advani, MD sent at 08/16/2014 12:54 PM EST ----- Call and let the patient know that her LFTs show improvement and his hepatitis panel is negative.

## 2014-08-17 NOTE — Telephone Encounter (Signed)
Patient is aware of his recent lab results

## 2015-08-14 ENCOUNTER — Emergency Department (HOSPITAL_COMMUNITY)
Admission: EM | Admit: 2015-08-14 | Discharge: 2015-08-14 | Disposition: A | Discharge status: Home / Self Care | Payer: Self-pay

## 2015-08-14 ENCOUNTER — Encounter (HOSPITAL_COMMUNITY): Payer: Self-pay | Admitting: Emergency Medicine

## 2015-08-14 ENCOUNTER — Emergency Department (HOSPITAL_COMMUNITY)
Admission: EM | Admit: 2015-08-14 | Discharge: 2015-08-14 | Disposition: A | Discharge status: Home / Self Care | Payer: Self-pay | Attending: Emergency Medicine | Admitting: Emergency Medicine

## 2015-08-14 DIAGNOSIS — Z036 Encounter for observation for suspected toxic effect from ingested substance ruled out: Secondary | ICD-10-CM | POA: Insufficient documentation

## 2015-08-14 DIAGNOSIS — F17210 Nicotine dependence, cigarettes, uncomplicated: Secondary | ICD-10-CM | POA: Insufficient documentation

## 2015-08-14 NOTE — ED Notes (Signed)
Unable to locate pt x3 attempts 

## 2015-08-14 NOTE — ED Notes (Signed)
Called pt for lab draw,  No answer

## 2015-08-14 NOTE — ED Notes (Signed)
Upon attempting to triage pt, pt not in room - unable to locate pt at this time.
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
## 2015-11-26 IMAGING — CR DG CHEST 2V
2 series · 2 of 2 positions shown · non-contrast
Comparison: 11/29/2013

CLINICAL DATA: Slip and fall 5 days ago with persistent left chest
pain

EXAM:
CHEST  2 VIEW

[chest pa]
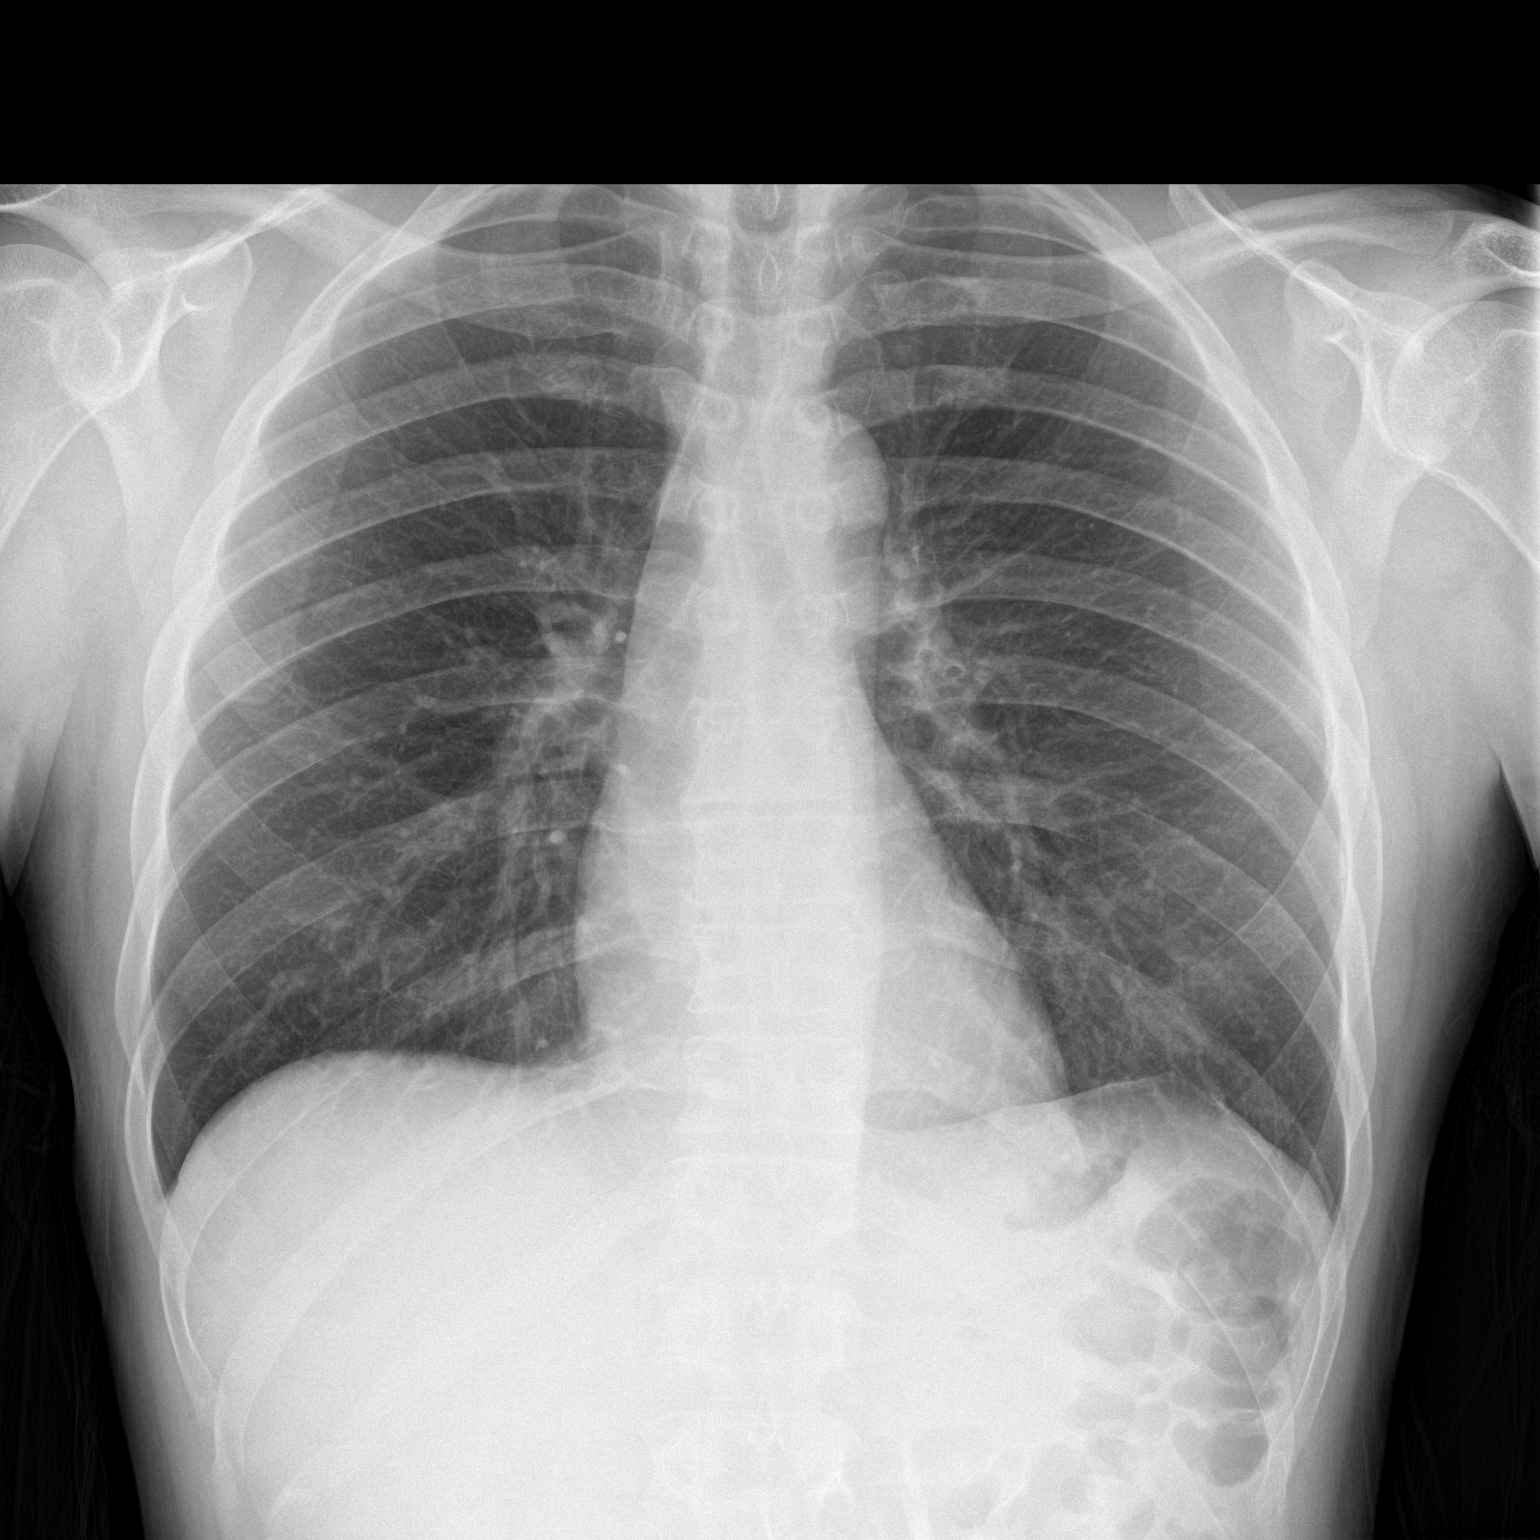

[chest lat]
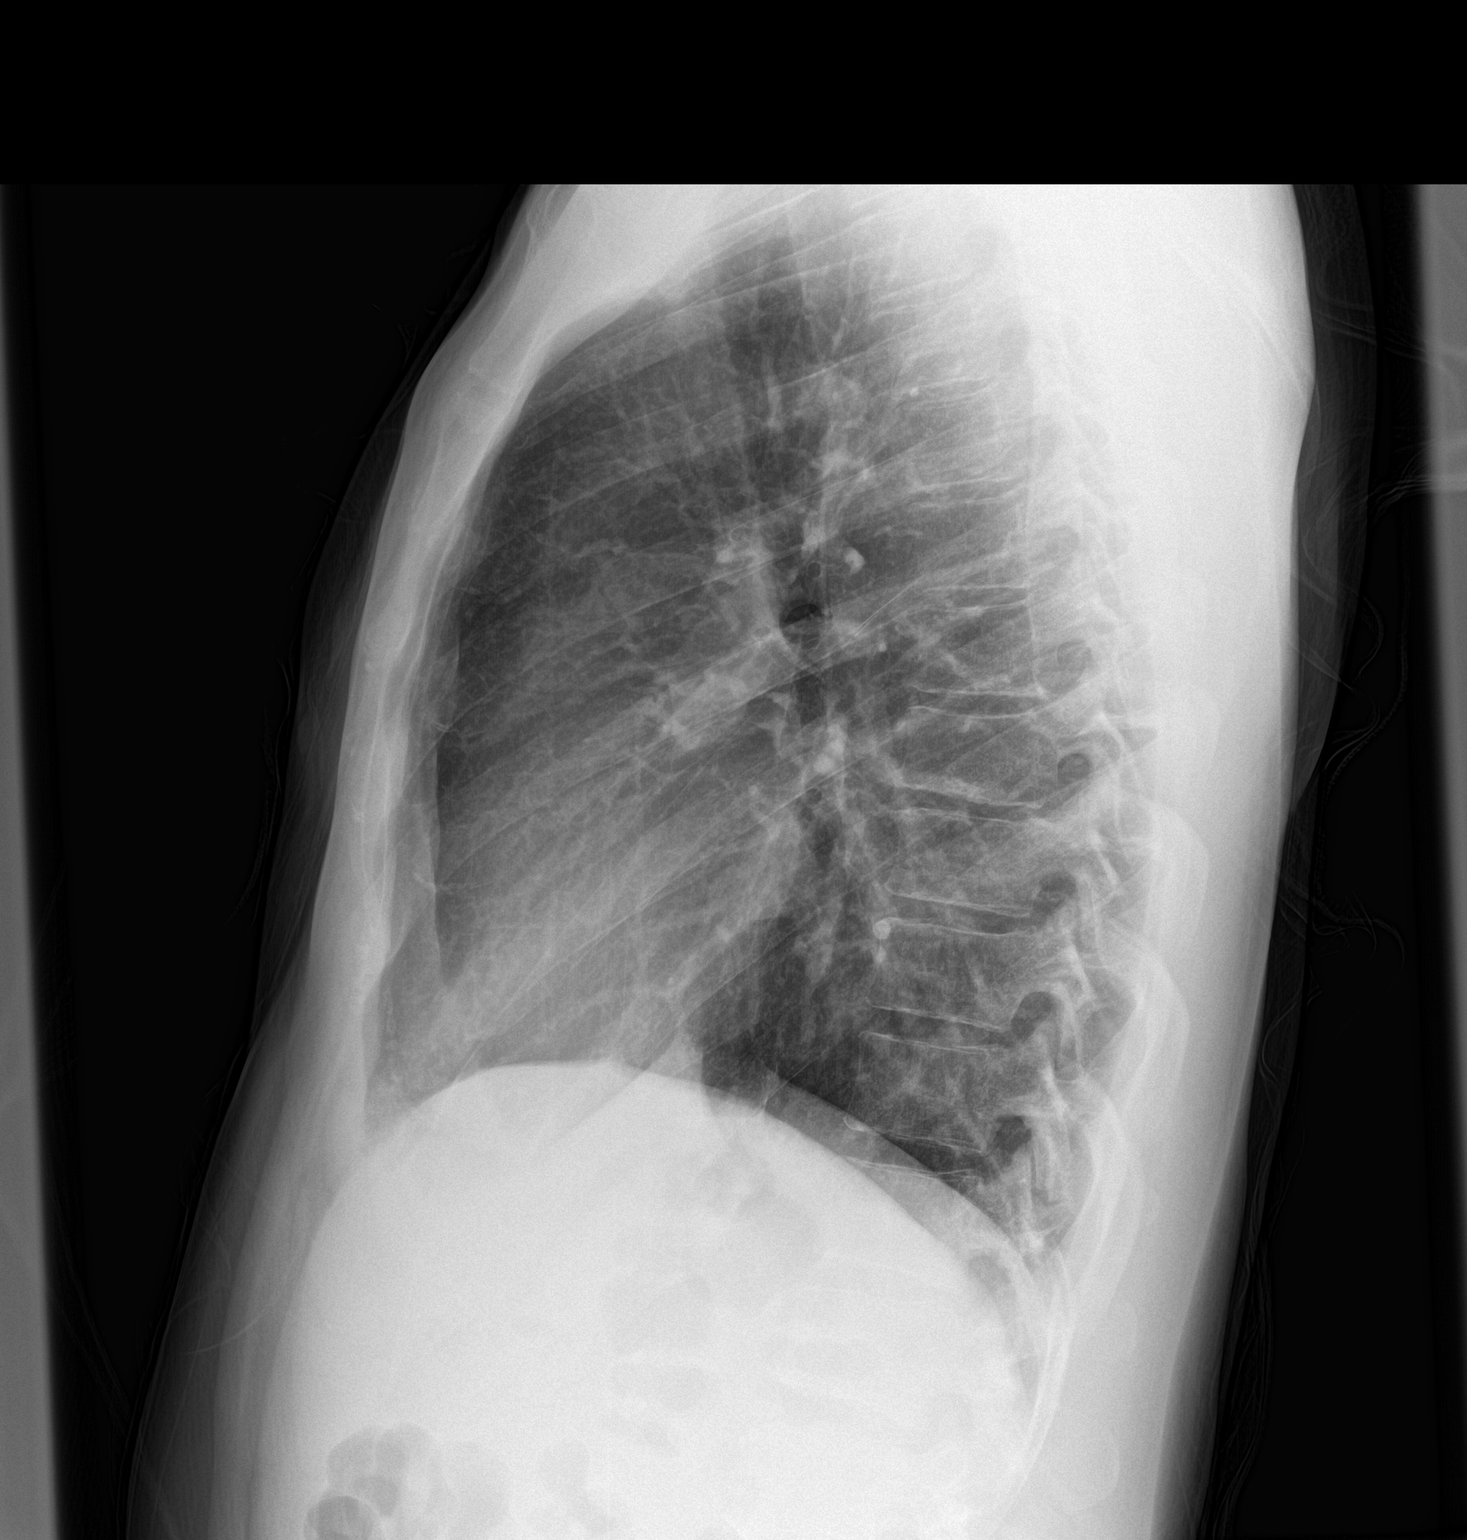

[2 of 2 positions shown; findings below may reference images not displayed]

FINDINGS: Cardiac shadow is within normal limits. The lungs are well aerated
bilaterally. The bony structures are within normal limits. The
visualized upper abdomen is unremarkable.
IMPRESSION: No active cardiopulmonary disease.

## 2023-04-30 DEATH — deceased
